# Patient Record
Sex: Male | Born: 1970 | Race: Black or African American | Hispanic: No | Marital: Married | Smoking: Never smoker
Health system: Southern US, Community
[De-identification: ages and names within clinical notes are randomized; demographics above are authoritative.]

## PROBLEM LIST (undated history)

## (undated) DIAGNOSIS — K922 Gastrointestinal hemorrhage, unspecified: Secondary | ICD-10-CM

## (undated) DIAGNOSIS — K219 Gastro-esophageal reflux disease without esophagitis: Secondary | ICD-10-CM

## (undated) HISTORY — PX: APPENDECTOMY: SHX54

---

## 2006-04-30 ENCOUNTER — Emergency Department (HOSPITAL_COMMUNITY): Admission: EM | Admit: 2006-04-30 | Discharge: 2006-04-30 | Payer: Self-pay | Admitting: Emergency Medicine

## 2009-04-08 ENCOUNTER — Emergency Department (HOSPITAL_COMMUNITY): Admission: EM | Admit: 2009-04-08 | Discharge: 2009-04-09 | Payer: Self-pay | Admitting: Emergency Medicine

## 2009-05-10 ENCOUNTER — Emergency Department (HOSPITAL_COMMUNITY): Admission: EM | Admit: 2009-05-10 | Discharge: 2009-05-10 | Payer: Self-pay | Admitting: Emergency Medicine

## 2009-12-07 ENCOUNTER — Emergency Department (HOSPITAL_BASED_OUTPATIENT_CLINIC_OR_DEPARTMENT_OTHER)
Admission: EM | Admit: 2009-12-07 | Discharge: 2009-12-07 | Payer: Self-pay | Source: Home / Self Care | Admitting: Emergency Medicine

## 2010-05-29 LAB — CBC
MCHC: 33.4 g/dL (ref 30.0–36.0)
MCV: 87.6 fL (ref 78.0–100.0)
Platelets: 225 10*3/uL (ref 150–400)
RBC: 4.83 MIL/uL (ref 4.22–5.81)
RDW: 12.9 % (ref 11.5–15.5)

## 2010-05-29 LAB — BASIC METABOLIC PANEL
BUN: 10 mg/dL (ref 6–23)
CO2: 28 mEq/L (ref 19–32)
Chloride: 103 mEq/L (ref 96–112)
Glucose, Bld: 113 mg/dL — ABNORMAL HIGH (ref 70–99)
Potassium: 3.8 mEq/L (ref 3.5–5.1)

## 2010-05-29 LAB — DIFFERENTIAL
Eosinophils Relative: 1 % (ref 0–5)
Lymphocytes Relative: 8 % — ABNORMAL LOW (ref 12–46)
Lymphs Abs: 0.5 10*3/uL — ABNORMAL LOW (ref 0.7–4.0)
Monocytes Absolute: 0.9 10*3/uL (ref 0.1–1.0)
Monocytes Relative: 14 % — ABNORMAL HIGH (ref 3–12)

## 2010-06-01 LAB — CBC
HCT: 41.6 % (ref 39.0–52.0)
Hemoglobin: 13.9 g/dL (ref 13.0–17.0)
Platelets: 292 10*3/uL (ref 150–400)

## 2010-06-01 LAB — DIFFERENTIAL
Eosinophils Absolute: 0.1 10*3/uL (ref 0.0–0.7)
Lymphs Abs: 2 10*3/uL (ref 0.7–4.0)
Monocytes Absolute: 0.6 10*3/uL (ref 0.1–1.0)
Monocytes Relative: 15 % — ABNORMAL HIGH (ref 3–12)
Neutrophils Relative %: 36 % — ABNORMAL LOW (ref 43–77)

## 2010-06-01 LAB — URINALYSIS, ROUTINE W REFLEX MICROSCOPIC
Glucose, UA: NEGATIVE mg/dL
Nitrite: NEGATIVE
Specific Gravity, Urine: 1.012 (ref 1.005–1.030)
pH: 7.5 (ref 5.0–8.0)

## 2010-06-01 LAB — COMPREHENSIVE METABOLIC PANEL
Albumin: 3.9 g/dL (ref 3.5–5.2)
Alkaline Phosphatase: 65 U/L (ref 39–117)
BUN: 12 mg/dL (ref 6–23)
CO2: 28 mEq/L (ref 19–32)
Chloride: 106 mEq/L (ref 96–112)
Creatinine, Ser: 0.9 mg/dL (ref 0.4–1.5)
GFR calc non Af Amer: >60 mL/min (ref >60)
Glucose, Bld: 78 mg/dL (ref 70–99)
Potassium: 3.7 mEq/L (ref 3.5–5.1)
Total Bilirubin: 0.8 mg/dL (ref 0.3–1.2)

## 2010-12-06 ENCOUNTER — Emergency Department (INDEPENDENT_AMBULATORY_CARE_PROVIDER_SITE_OTHER)

## 2010-12-06 ENCOUNTER — Encounter: Payer: Self-pay | Admitting: *Deleted

## 2010-12-06 ENCOUNTER — Emergency Department (HOSPITAL_BASED_OUTPATIENT_CLINIC_OR_DEPARTMENT_OTHER)
Admission: EM | Admit: 2010-12-06 | Discharge: 2010-12-06 | Disposition: A | Discharge status: Home / Self Care | Attending: Emergency Medicine | Admitting: Emergency Medicine

## 2010-12-06 DIAGNOSIS — R1011 Right upper quadrant pain: Secondary | ICD-10-CM | POA: Insufficient documentation

## 2010-12-06 DIAGNOSIS — R1013 Epigastric pain: Secondary | ICD-10-CM

## 2010-12-06 DIAGNOSIS — R079 Chest pain, unspecified: Secondary | ICD-10-CM

## 2010-12-06 LAB — URINALYSIS, ROUTINE W REFLEX MICROSCOPIC
Ketones, ur: NEGATIVE mg/dL
Leukocytes, UA: NEGATIVE
Nitrite: NEGATIVE
Protein, ur: NEGATIVE mg/dL
pH: 7.5 (ref 5.0–8.0)

## 2010-12-06 LAB — COMPREHENSIVE METABOLIC PANEL
AST: 16 U/L (ref 0–37)
CO2: 31 mEq/L (ref 19–32)
Calcium: 9.7 mg/dL (ref 8.4–10.5)
Creatinine, Ser: 1 mg/dL (ref 0.50–1.35)
GFR calc Af Amer: >60 mL/min (ref >60)
GFR calc non Af Amer: >60 mL/min (ref >60)
Glucose, Bld: 87 mg/dL (ref 70–99)

## 2010-12-06 MED ORDER — PANTOPRAZOLE SODIUM 20 MG PO TBEC: 40.0000 mg | DELAYED_RELEASE_TABLET | Freq: Every day | ORAL | Status: DC

## 2010-12-06 MED ORDER — SODIUM CHLORIDE 0.9 % IV SOLN
Freq: Once | INTRAVENOUS | Status: AC
  Administered 2010-12-06: 15:00:00 via INTRAVENOUS

## 2010-12-06 MED ORDER — GI COCKTAIL ~~LOC~~
ORAL | Status: AC
  Administered 2010-12-06: 30 mL
  Filled 2010-12-06: qty 30

## 2010-12-06 MED ORDER — PANTOPRAZOLE SODIUM 40 MG PO TBEC
40.0000 mg | DELAYED_RELEASE_TABLET | Freq: Once | ORAL | Status: AC
  Administered 2010-12-06: 40 mg via ORAL
  Filled 2010-12-06: qty 1

## 2010-12-06 NOTE — ED Notes (Signed)
Urinal provided.

## 2010-12-06 NOTE — ED Notes (Signed)
Pt transported to radiology.

## 2010-12-06 NOTE — ED Provider Notes (Signed)
History     CSN: [id number] Arrival date & time: 12/06/2010  1:41 PM  Chief Complaint  Patient presents with  . Abdominal Pain    HPI  (Consider location/radiation/quality/duration/timing/severity/associated sxs/prior treatment)  HPI Comments: States entire abdomen hurts but it is worst in RUQ and epigastrium.  Pain improves with eating.  Patient is a 40 y.o. male presenting with abdominal pain. The history is provided by the patient. No language interpreter was used.  Abdominal Pain The primary symptoms of the illness include abdominal pain. The primary symptoms of the illness do not include fever, nausea, vomiting, diarrhea or hematochezia. Episode onset: 2 weeks ago. The onset of the illness was gradual. The problem has not changed since onset. The patient has not had a change in bowel habit. Additional symptoms associated with the illness include back pain. Symptoms associated with the illness do not include diaphoresis, heartburn, constipation, urgency, hematuria or frequency.    History reviewed. No pertinent past medical history.  Past Surgical History  Procedure Date  . Appendectomy     History reviewed. No pertinent family history.  History  Substance Use Topics  . Smoking status: Never Smoker   . Smokeless tobacco: Not on file  . Alcohol Use: No      Review of Systems  Review of Systems  Constitutional: Negative for fever and diaphoresis.  Gastrointestinal: Positive for abdominal pain. Negative for heartburn, nausea, vomiting, diarrhea, constipation, blood in stool, hematochezia and rectal pain.  Genitourinary: Negative for urgency, frequency and hematuria.  Musculoskeletal: Positive for back pain.    Allergies  Review of patient's allergies indicates no known allergies.  Home Medications  No current outpatient prescriptions on file.  Physical Exam    BP 121/70  Pulse 62  Temp(Src) 97.8 F (36.6 C) (Oral)  Resp 16  Ht 5\' 5"  (1.651 m)  Wt 175 lb  (79.379 kg)  BMI 29.12 kg/m2  SpO2 100%  Physical Exam  Nursing note and vitals reviewed. Constitutional: He is oriented to person, place, and time. He appears well-developed and well-nourished. No distress.  HENT:  Head: Normocephalic and atraumatic.  Eyes: EOM are normal.  Neck: Normal range of motion.  Cardiovascular: Normal rate, regular rhythm, normal heart sounds and intact distal pulses.   Pulmonary/Chest: Effort normal and breath sounds normal. No respiratory distress.  Abdominal: Soft. He exhibits no distension and no mass. There is tenderness. There is no rebound and no guarding.       + murphy's sign.  + PT in epigastrium  Musculoskeletal: Normal range of motion.       Lumbar back: He exhibits pain. He exhibits normal range of motion and no swelling.       Arms: Neurological: He is alert and oriented to person, place, and time.  Skin: Skin is warm and dry. He is not diaphoretic.  Psychiatric: He has a normal mood and affect. Judgment normal.    ED Course  Procedures (including critical care time)   Labs Reviewed  COMPREHENSIVE METABOLIC PANEL  LIPASE, BLOOD  URINALYSIS, ROUTINE W REFLEX MICROSCOPIC   No results found.   No diagnosis found.   MDM   1750-reviewed labs and xray ressults with pt.  He has been given a GI cocktail.  i will check back on him shortly to see if it has helped.     Worthy Rancher, PA 12/06/10 1517  Worthy Rancher, PA 12/20/10 [phone]

## 2010-12-06 NOTE — ED Notes (Signed)
Hyacinth Meeker, PA-C at bedside speaking with pt.

## 2010-12-06 NOTE — ED Notes (Signed)
Pt c/o generalized abd " squeezing" around abd and ribs x 2 weeks

## 2010-12-14 NOTE — ED Provider Notes (Signed)
Medical screening examination/treatment/procedure(s) were performed by non-physician practitioner and as supervising physician I was immediately available for consultation/collaboration.   Charles B. Bernette Mayers, MD 12/14/10 [phone]

## 2010-12-31 NOTE — ED Provider Notes (Signed)
History/physical exam/procedure(s) were performed by non-physician practitioner and as supervising physician I was immediately available for consultation/collaboration. I have reviewed all notes and am in agreement with care and plan.   Hilario Quarry, MD 12/31/10 [phone]

## 2012-05-20 ENCOUNTER — Emergency Department (HOSPITAL_COMMUNITY)

## 2012-05-20 ENCOUNTER — Emergency Department (HOSPITAL_COMMUNITY)
Admission: EM | Admit: 2012-05-20 | Discharge: 2012-05-20 | Disposition: A | Discharge status: Home / Self Care | Attending: Emergency Medicine | Admitting: Emergency Medicine

## 2012-05-20 ENCOUNTER — Encounter (HOSPITAL_COMMUNITY): Payer: Self-pay | Admitting: Emergency Medicine

## 2012-05-20 DIAGNOSIS — M549 Dorsalgia, unspecified: Secondary | ICD-10-CM | POA: Insufficient documentation

## 2012-05-20 DIAGNOSIS — K219 Gastro-esophageal reflux disease without esophagitis: Secondary | ICD-10-CM | POA: Insufficient documentation

## 2012-05-20 DIAGNOSIS — R071 Chest pain on breathing: Secondary | ICD-10-CM | POA: Insufficient documentation

## 2012-05-20 LAB — CBC WITH DIFFERENTIAL/PLATELET
Basophils Absolute: 0.1 10*3/uL (ref 0.0–0.1)
Basophils Relative: 1 % (ref 0–1)
HCT: 40.7 % (ref 39.0–52.0)
Hemoglobin: 14.1 g/dL (ref 13.0–17.0)
Lymphocytes Relative: 47 % — ABNORMAL HIGH (ref 12–46)
MCHC: 34.6 g/dL (ref 30.0–36.0)
Monocytes Absolute: 0.7 10*3/uL (ref 0.1–1.0)
Neutro Abs: 1.5 10*3/uL — ABNORMAL LOW (ref 1.7–7.7)
Neutrophils Relative %: 33 % — ABNORMAL LOW (ref 43–77)
RDW: 12.8 % (ref 11.5–15.5)
WBC: 4.7 10*3/uL (ref 4.0–10.5)

## 2012-05-20 LAB — COMPREHENSIVE METABOLIC PANEL
ALT: 20 U/L (ref 0–53)
BUN: 15 mg/dL (ref 6–23)
CO2: 31 mEq/L (ref 19–32)
Calcium: 9.2 mg/dL (ref 8.4–10.5)
GFR calc Af Amer: >90 mL/min (ref >90)
GFR calc non Af Amer: 82 mL/min — ABNORMAL LOW (ref >90)
Glucose, Bld: 90 mg/dL (ref 70–99)
Total Protein: 7.1 g/dL (ref 6.0–8.3)

## 2012-05-20 LAB — POCT I-STAT TROPONIN I: Troponin i, poc: 0 ng/mL (ref 0.00–0.08)

## 2012-05-20 MED ORDER — OMEPRAZOLE 20 MG PO CPDR: 40.0000 mg | DELAYED_RELEASE_CAPSULE | Freq: Every day | ORAL | Status: DC

## 2012-05-20 MED ORDER — PANTOPRAZOLE SODIUM 40 MG PO TBEC
40.0000 mg | DELAYED_RELEASE_TABLET | Freq: Once | ORAL | Status: AC
  Administered 2012-05-20: 40 mg via ORAL
  Filled 2012-05-20: qty 1

## 2012-05-20 MED ORDER — KETOROLAC TROMETHAMINE 30 MG/ML IJ SOLN
30.0000 mg | Freq: Once | INTRAMUSCULAR | Status: AC
  Administered 2012-05-20: 30 mg via INTRAVENOUS
  Filled 2012-05-20: qty 1

## 2012-05-20 MED ORDER — IBUPROFEN 600 MG PO TABS: 600.0000 mg | ORAL_TABLET | Freq: Three times a day (TID) | ORAL | Status: DC | PRN

## 2012-05-20 MED ORDER — GI COCKTAIL ~~LOC~~
30.0000 mL | Freq: Once | ORAL | Status: AC
  Administered 2012-05-20: 30 mL via ORAL
  Filled 2012-05-20: qty 30

## 2012-05-20 MED ORDER — METHOCARBAMOL 500 MG PO TABS: 500.0000 mg | ORAL_TABLET | Freq: Three times a day (TID) | ORAL | Status: DC

## 2012-05-20 NOTE — ED Notes (Signed)
Pt states he is having pain around his ribs and into his back  Pt states it has been hurting for 2 weeks  Pt states it is hard for him to lay on his back

## 2012-05-20 NOTE — ED Provider Notes (Signed)
History     CSN: [id number]  Arrival date & time 05/20/12  0038   None     Chief Complaint  Patient presents with  . chest wall pain     (Consider location/radiation/quality/duration/timing/severity/associated sxs/prior treatment) HPI Melvin Medina is a 42 y.o. male presenting with left chest wall pain and left back pain that started after working out 2 weeks ago, it is exacerbated by lifting weights.  He has had similar sx before and waas treated for GERD, but is not takingn any of those medications before.  Eating does not exace Left-sided chest wall, feels like musculoskeletal patient, not pleuritic, no dGE risk, intermittent, worse at the gym which she's taken some time off, going on for 2 weeks. History reviewed. No pertinent past medical history.  Past Surgical History  Procedure Laterality Date  . Appendectomy      History reviewed. No pertinent family history.  History  Substance Use Topics  . Smoking status: Never Smoker   . Smokeless tobacco: Not on file  . Alcohol Use: No      Review of Systems At least 10pt or greater review of systems completed and are negative except where specified in the HPI.  Allergies  Review of patient's allergies indicates no known allergies.  Home Medications  No current outpatient prescriptions on file.  BP 116/66  Pulse 58  Temp(Src) 98 F (36.7 C) (Oral)  Resp 16  SpO2 98%  Physical Exam  Nursing notes reviewed.  Electronic medical record reviewed. VITAL SIGNS:   Filed Vitals:   05/20/12 0049 05/20/12 0707 05/20/12 0838  BP: 116/66 109/84 101/77  Pulse: 58 52 55  Temp: 98 F (36.7 C)  98.6 F (37 C)  TempSrc: Oral  Oral  Resp: 16 16   SpO2: 98% 100% 100%   CONSTITUTIONAL: Awake, oriented, appears non-toxic HENT: Atraumatic, normocephalic, oral mucosa pink and moist, airway patent. Nares patent without drainage. External ears normal. EYES: Conjunctiva clear, EOMI, PERRLA NECK: Trachea midline,  non-tender, supple CARDIOVASCULAR: Normal heart rate, Normal rhythm, No murmurs, rubs, gallops PULMONARY/CHEST: Clear to auscultation, no rhonchi, wheezes, or rales. Symmetrical breath sounds. Non-tender. ABDOMINAL: Non-distended, soft, non-tender - no rebound or guarding.  BS normal. BACK: No steps off or deformity, some pain left lumbar thoracic muscles. NEUROLOGIC: Non-focal, moving all four extremities, no gross sensory or motor deficits. EXTREMITIES: No clubbing, cyanosis, or edema SKIN: Warm, Dry, No erythema, No rash  ED Course  Procedures (including critical care time)  Date: 05/20/2012  Rate: 54  Rhythm: Sinus bradycardia  QRS Axis: normal  Intervals: normal  ST/T Wave abnormalities: normal  Conduction Disutrbances: none  Narrative Interpretation: Patient's last EKG shows sinus tachycardia patient's last EKG also showed a possible rightward axis is not evident here, this is a normal axis, no significant changes, no ST or T wave abnormalities consistent with acute ischemia or infarction  Labs Reviewed  COMPREHENSIVE METABOLIC PANEL - Abnormal; Notable for the following:    GFR calc non Af Amer 82 (*)    All other components within normal limits  CBC WITH DIFFERENTIAL - Abnormal; Notable for the following:    Neutrophils Relative 33 (*)    Neutro Abs 1.5 (*)    Lymphocytes Relative 47 (*)    Monocytes Relative 16 (*)    All other components within normal limits  LIPASE, BLOOD  POCT I-STAT TROPONIN I   No results found.   1. Chest wall pain   2. GERD (gastroesophageal reflux disease)  MDM  PT presents with likely MSK pain vs GERD or both concurrently, doubt ACS, likewise doubt pericarditis, aortic dissection, pulmonary embolism, tension pneumothorax, pneumonia, and esophageal rupture.  CXR as viewed by myself is non-acute, EKG shows sinus bradycardia.  No relief from GI cocktail, feels better with toradol.  Rx for protonix given, use OTC acid relievers PRN.   Judicious use of NSAIDS, tylenol for back/chest wall pain.   I explained the diagnosis and have given explicit precautions to return to the ER including any other new or worsening symptoms. The patient understands and accepts the medical plan as it's been dictated and I have answered their questions. Discharge instructions concerning home care and prescriptions have been given.  The patient is STABLE and is discharged to home in good condition.           Jones Skene, MD 05/23/12 1107

## 2013-02-25 ENCOUNTER — Encounter (HOSPITAL_COMMUNITY): Payer: Self-pay | Admitting: Emergency Medicine

## 2013-02-25 ENCOUNTER — Emergency Department (HOSPITAL_COMMUNITY)
Admission: EM | Admit: 2013-02-25 | Discharge: 2013-02-26 | Disposition: A | Discharge status: Home / Self Care | Attending: Emergency Medicine | Admitting: Emergency Medicine

## 2013-02-25 DIAGNOSIS — M545 Low back pain, unspecified: Secondary | ICD-10-CM | POA: Insufficient documentation

## 2013-02-25 DIAGNOSIS — M7918 Myalgia, other site: Secondary | ICD-10-CM

## 2013-02-25 DIAGNOSIS — M549 Dorsalgia, unspecified: Secondary | ICD-10-CM

## 2013-02-25 NOTE — ED Notes (Signed)
Pt states having chest pain x 1 wk; constant; states worse today after lifting weights; c/o back pain

## 2013-02-26 MED ORDER — METHOCARBAMOL 750 MG PO TABS: 750.0000 mg | ORAL_TABLET | Freq: Three times a day (TID) | ORAL | Status: DC

## 2013-02-26 MED ORDER — NAPROXEN 500 MG PO TABS: 500.0000 mg | ORAL_TABLET | Freq: Two times a day (BID) | ORAL | Status: DC

## 2013-02-26 MED ORDER — METHOCARBAMOL 500 MG PO TABS
1000.0000 mg | ORAL_TABLET | Freq: Once | ORAL | Status: AC
  Administered 2013-02-26: 1000 mg via ORAL
  Filled 2013-02-26: qty 2

## 2013-02-26 MED ORDER — NAPROXEN 500 MG PO TABS
500.0000 mg | ORAL_TABLET | Freq: Two times a day (BID) | ORAL | Status: DC
  Administered 2013-02-26: 500 mg via ORAL
  Filled 2013-02-26: qty 1

## 2013-02-26 NOTE — ED Provider Notes (Signed)
CSN: [id number]     Arrival date & time 02/25/13  2349 History   First MD Initiated Contact with Patient 02/26/13 0000     Chief Complaint  Patient presents with  . Chest Pain   (Consider location/radiation/quality/duration/timing/severity/associated sxs/prior Treatment) HPI 42 year old male presents to emergency room with complaint of right-sided back pain extending around into his right chest.  He reports pain also on the left lower back extending around to his abdomen, but this is less frequently. Patient reports pain started about a week ago after he was lifting weights.  Patient reports he's been taking Advil which helps for a few hours, but then the pain returns.  Patient tried [place] powder today, which lasted longer.  He denies any shortness of breath, no fever, no cough.  Pain is worse with movement and palpation.  No prior history of coronary disease, asthma, or other lung, heart issues. History reviewed. No pertinent past medical history. Past Surgical History  Procedure Laterality Date  . Appendectomy     No family history on file. History  Substance Use Topics  . Smoking status: Never Smoker   . Smokeless tobacco: Not on file  . Alcohol Use: No    Review of Systems  See History of Present Illness; otherwise all other systems are reviewed and negative Allergies  Review of patient's allergies indicates no known allergies.  Home Medications   Current Outpatient Rx  Name  Route  Sig  Dispense  Refill  . Aspirin-Acetaminophen-Caffeine (GOODYS EXTRA STRENGTH PO)   Oral   Take 1 packet by mouth daily as needed. For pain         . ibuprofen (ADVIL,MOTRIN) 200 MG tablet   Oral   Take 400-600 mg by mouth every 6 (six) hours as needed.          BP 123/71  Pulse 64  Temp(Src) 97.9 F (36.6 C) (Oral)  Resp 15  SpO2 96% Physical Exam  Nursing note and vitals reviewed. Constitutional: He is oriented to person, place, and time. He appears well-developed and  well-nourished. No distress.  HENT:  Head: Normocephalic and atraumatic.  Nose: Nose normal.  Mouth/Throat: Oropharynx is clear and moist.  Eyes: Conjunctivae and EOM are normal. Pupils are equal, round, and reactive to light.  Neck: Normal range of motion. Neck supple. No JVD present. No tracheal deviation present. No thyromegaly present.  Cardiovascular: Normal rate, regular rhythm, normal heart sounds and intact distal pulses.  Exam reveals no gallop and no friction rub.   No murmur heard. Pulmonary/Chest: Effort normal and breath sounds normal. No stridor. No respiratory distress. He has no wheezes. He has no rales. He exhibits no tenderness.  Abdominal: Soft. Bowel sounds are normal. He exhibits no distension and no mass. There is no tenderness. There is no rebound and no guarding.  No epigastric pain, no abdominal pain  Musculoskeletal: Normal range of motion. He exhibits tenderness. He exhibits no edema.  Patient has mild tenderness to paraspinal muscles along right lumbar.  Patient also tender across right trapezius and inferior rotator cuff.  Pain with movement of the right shoulder.  Lymphadenopathy:    He has no cervical adenopathy.  Neurological: He is alert and oriented to person, place, and time. He has normal reflexes. He exhibits normal muscle tone. Coordination normal.  Skin: Skin is warm and dry. No rash noted. No erythema. No pallor.  Psychiatric: He has a normal mood and affect. His behavior is normal. Judgment and thought content normal.  ED Course  Procedures (including critical care time) Labs Review Labs Reviewed - No data to display Imaging Review No results found.  EKG Interpretation    Date/Time:  Wednesday February 26 2013 00:01:10 EST Ventricular Rate:  65 PR Interval:  170 QRS Duration: 101 QT Interval:  386 QTC Calculation: 401 R Axis:   84 Text Interpretation:  Sinus rhythm Benign early repolarization No significant change since last tracing  Confirmed by Jazir Newey  MD, Cranford Blessinger ([id number]) on 02/26/2013 12:04:15 AM            MDM   1. Back pain   2. Musculoskeletal pain    42 year old male with musculoskeletal pain.  Will treat with Robaxin and Naprosyn.  Will refer to local.  Primary care Dr. for further evaluation.  Patient is requesting IV medication, and GI cocktail, but at this time.  He does not report any signs of GERD, and I do not feel  IV medication is warranted in his situation.     Olivia Mackie, MD 02/26/13 [phone]

## 2013-02-28 ENCOUNTER — Encounter (HOSPITAL_BASED_OUTPATIENT_CLINIC_OR_DEPARTMENT_OTHER): Payer: Self-pay | Admitting: Emergency Medicine

## 2013-02-28 ENCOUNTER — Emergency Department (HOSPITAL_BASED_OUTPATIENT_CLINIC_OR_DEPARTMENT_OTHER)
Admission: EM | Admit: 2013-02-28 | Discharge: 2013-02-28 | Disposition: A | Discharge status: Home / Self Care | Attending: Emergency Medicine | Admitting: Emergency Medicine

## 2013-02-28 ENCOUNTER — Emergency Department (HOSPITAL_BASED_OUTPATIENT_CLINIC_OR_DEPARTMENT_OTHER)

## 2013-02-28 DIAGNOSIS — M549 Dorsalgia, unspecified: Secondary | ICD-10-CM | POA: Insufficient documentation

## 2013-02-28 DIAGNOSIS — Z791 Long term (current) use of non-steroidal anti-inflammatories (NSAID): Secondary | ICD-10-CM | POA: Insufficient documentation

## 2013-02-28 DIAGNOSIS — R1110 Vomiting, unspecified: Secondary | ICD-10-CM | POA: Insufficient documentation

## 2013-02-28 DIAGNOSIS — R109 Unspecified abdominal pain: Secondary | ICD-10-CM

## 2013-02-28 DIAGNOSIS — R1084 Generalized abdominal pain: Secondary | ICD-10-CM | POA: Insufficient documentation

## 2013-02-28 DIAGNOSIS — Z79899 Other long term (current) drug therapy: Secondary | ICD-10-CM | POA: Insufficient documentation

## 2013-02-28 LAB — CBC WITH DIFFERENTIAL/PLATELET
Basophils Absolute: 0 10*3/uL (ref 0.0–0.1)
Basophils Relative: 1 % (ref 0–1)
Eosinophils Absolute: 0.1 10*3/uL (ref 0.0–0.7)
Eosinophils Relative: 2 % (ref 0–5)
HCT: 39.1 % (ref 39.0–52.0)
Hemoglobin: 13.7 g/dL (ref 13.0–17.0)
Lymphocytes Relative: 40 % (ref 12–46)
Lymphs Abs: 1.4 10*3/uL (ref 0.7–4.0)
MCH: 29.4 pg (ref 26.0–34.0)
MCHC: 35 g/dL (ref 30.0–36.0)
MCV: 83.9 fL (ref 78.0–100.0)
Monocytes Absolute: 0.7 10*3/uL (ref 0.1–1.0)
Monocytes Relative: 21 % — ABNORMAL HIGH (ref 3–12)
Neutro Abs: 1.2 10*3/uL — ABNORMAL LOW (ref 1.7–7.7)
Neutrophils Relative %: 36 % — ABNORMAL LOW (ref 43–77)
Platelets: 223 10*3/uL (ref 150–400)
RBC: 4.66 MIL/uL (ref 4.22–5.81)
RDW: 12.3 % (ref 11.5–15.5)
WBC: 3.4 10*3/uL — ABNORMAL LOW (ref 4.0–10.5)

## 2013-02-28 LAB — COMPREHENSIVE METABOLIC PANEL
ALT: 21 U/L (ref 0–53)
AST: 20 U/L (ref 0–37)
Albumin: 4.1 g/dL (ref 3.5–5.2)
Alkaline Phosphatase: 62 U/L (ref 39–117)
BUN: 15 mg/dL (ref 6–23)
CO2: 25 mEq/L (ref 19–32)
Calcium: 8.9 mg/dL (ref 8.4–10.5)
Chloride: 104 mEq/L (ref 96–112)
Creatinine, Ser: 1.1 mg/dL (ref 0.50–1.35)
GFR calc Af Amer: >90 mL/min (ref >90)
GFR calc non Af Amer: 81 mL/min — ABNORMAL LOW (ref >90)
Glucose, Bld: 104 mg/dL — ABNORMAL HIGH (ref 70–99)
Potassium: 3.7 mEq/L (ref 3.5–5.1)
Sodium: 139 mEq/L (ref 135–145)
Total Bilirubin: 0.6 mg/dL (ref 0.3–1.2)
Total Protein: 6.9 g/dL (ref 6.0–8.3)

## 2013-02-28 LAB — URINALYSIS, ROUTINE W REFLEX MICROSCOPIC
Glucose, UA: NEGATIVE mg/dL
Hgb urine dipstick: NEGATIVE
Ketones, ur: NEGATIVE mg/dL
Leukocytes, UA: NEGATIVE
pH: 5.5 (ref 5.0–8.0)

## 2013-02-28 MED ORDER — GI COCKTAIL ~~LOC~~: 30.0000 mL | Freq: Once | ORAL | Status: AC

## 2013-02-28 MED ORDER — TRAMADOL HCL 50 MG PO TABS: 50.0000 mg | ORAL_TABLET | Freq: Four times a day (QID) | ORAL | Status: DC | PRN

## 2013-02-28 MED ORDER — DICYCLOMINE HCL 20 MG PO TABS: 20.0000 mg | ORAL_TABLET | Freq: Two times a day (BID) | ORAL | Status: DC

## 2013-02-28 MED ORDER — GI COCKTAIL ~~LOC~~
ORAL | Status: AC
  Administered 2013-02-28: 30 mL via ORAL
  Filled 2013-02-28: qty 30

## 2013-02-28 NOTE — ED Provider Notes (Signed)
CSN: [id number]     Arrival date & time 02/28/13  7829 History   First MD Initiated Contact with Patient 02/28/13 [phone]     Chief Complaint  Patient presents with  . Abdominal Pain  . Emesis  . Back Pain   (Consider location/radiation/quality/duration/timing/severity/associated sxs/prior Treatment) HPI Comments: Patient presents to the ER for evaluation of abdominal and back pain. Patient reports that the pain starts in the lower abdomen, goes up across the upper abdomen and then around his sides and into his back. He has had this for 2 weeks. Patient says that he was seen previously for this and told it was muscle. He tried to take Advil and a muscle relaxer this morning but the pain did not help. He says that he made himself vomit by sticking his finger down his throat earlier because he thought it would help his symptoms, but it did not. He has not had any diarrhea or constipation.  Patient is a 42 y.o. male presenting with abdominal pain, vomiting, and back pain.  Abdominal Pain Associated symptoms: vomiting   Associated symptoms: no fever and no shortness of breath   Emesis Associated symptoms: abdominal pain   Back Pain Associated symptoms: abdominal pain   Associated symptoms: no fever     History reviewed. No pertinent past medical history. Past Surgical History  Procedure Laterality Date  . Appendectomy     History reviewed. No pertinent family history. History  Substance Use Topics  . Smoking status: Never Smoker   . Smokeless tobacco: Not on file  . Alcohol Use: No    Review of Systems  Constitutional: Negative for fever.  Respiratory: Negative for shortness of breath.   Gastrointestinal: Positive for vomiting and abdominal pain.  Genitourinary: Negative.   Musculoskeletal: Positive for back pain.  All other systems reviewed and are negative.    Allergies  Review of patient's allergies indicates no known allergies.  Home Medications   Current Outpatient  Rx  Name  Route  Sig  Dispense  Refill  . Aspirin-Acetaminophen-Caffeine (GOODYS EXTRA STRENGTH PO)   Oral   Take 1 packet by mouth daily as needed. For pain         . ibuprofen (ADVIL,MOTRIN) 200 MG tablet   Oral   Take 400-600 mg by mouth every 6 (six) hours as needed.         . methocarbamol (ROBAXIN-750) 750 MG tablet   Oral   Take 1 tablet (750 mg total) by mouth 3 (three) times daily.   60 tablet   0   . naproxen (NAPROSYN) 500 MG tablet   Oral   Take 1 tablet (500 mg total) by mouth 2 (two) times daily.   30 tablet   0    BP 136/71  Pulse 54  Temp(Src) 97.8 F (36.6 C) (Oral)  Resp 16  Ht 5\' 6"  (1.676 m)  Wt 180 lb (81.647 kg)  BMI 29.07 kg/m2  SpO2 100% Physical Exam  Constitutional: He is oriented to person, place, and time. He appears well-developed and well-nourished. No distress.  HENT:  Head: Normocephalic and atraumatic.  Right Ear: Hearing normal.  Left Ear: Hearing normal.  Nose: Nose normal.  Mouth/Throat: Oropharynx is clear and moist and mucous membranes are normal.  Eyes: Conjunctivae and EOM are normal. Pupils are equal, round, and reactive to light.  Neck: Normal range of motion. Neck supple.  Cardiovascular: Regular rhythm, S1 normal and S2 normal.  Exam reveals no gallop and no friction rub.  No murmur heard. Pulmonary/Chest: Effort normal and breath sounds normal. No respiratory distress. He exhibits no tenderness.  Abdominal: Soft. Normal appearance and bowel sounds are normal. There is no hepatosplenomegaly. There is tenderness (very mild and diffuse, no focality). There is no rebound, no guarding, no tenderness at McBurney's point and negative Murphy's sign. No hernia.  Musculoskeletal: Normal range of motion.  Neurological: He is alert and oriented to person, place, and time. He has normal strength. No cranial nerve deficit or sensory deficit. Coordination normal. GCS eye subscore is 4. GCS verbal subscore is 5. GCS motor subscore is 6.   Skin: Skin is warm, dry and intact. No rash noted. No cyanosis.  Psychiatric: He has a normal mood and affect. His speech is normal and behavior is normal. Thought content normal.    ED Course  Procedures (including critical care time) Labs Review Labs Reviewed  CBC WITH DIFFERENTIAL - Abnormal; Notable for the following:    WBC 3.4 (*)    Neutrophils Relative % 36 (*)    Neutro Abs 1.2 (*)    Monocytes Relative 21 (*)    All other components within normal limits  COMPREHENSIVE METABOLIC PANEL - Abnormal; Notable for the following:    Glucose, Bld 104 (*)    GFR calc non Af Amer 81 (*)    All other components within normal limits  LIPASE, BLOOD  URINALYSIS, ROUTINE W REFLEX MICROSCOPIC   Imaging Review Dg Abd Acute W/chest  02/28/2013   CLINICAL DATA:  Abdominal pain  EXAM: ACUTE ABDOMEN SERIES (ABDOMEN 2 VIEW & CHEST 1 VIEW)  COMPARISON:  05/20/2012  FINDINGS: There is no evidence of dilated bowel loops or free intraperitoneal air. No radiopaque calculi or other significant radiographic abnormality is seen. Heart size and mediastinal contours are within normal limits. Both lungs are clear. Mild lower thoracic and lumbar scoliosis noted. No osseous abnormality.  IMPRESSION: Negative abdominal radiographs.  No acute cardiopulmonary disease.   Electronically Signed   By: Ruel Favors M.D.   On: 02/28/2013 07:54    EKG Interpretation   None       MDM  Diagnosis: Abdominal pain  Patient presents to ER for diffuse abdominal pain radiating up into the chest and back region. Symptoms have been ongoing for 2 weeks. Exam is benign. He has diffuse abdominal tenderness which is benign. There is actually no focality, no guarding, no rebound. Patient has had previous appendectomy. No tenderness in right upper quadrant suggest gallbladder disease. He does have tenderness, pain seems to radiate from the abdomen up into the chest. No concern for primary cardiac disease causing the patient's  symptoms.  Labs are unremarkable. He does not have a leukocytosis. Transabdominal including LFTs entirely normal. Renal function normal. Lipase normal. Urinalysis without any abnormalities. Symptoms of unclear etiology at this time, will refer to GI. Treat abdominal discomfort with Bentyl.   Gilda Crease, MD 02/28/13 [phone]

## 2013-02-28 NOTE — ED Notes (Signed)
Pt states abd pain/back pain for two weeks. States has had this before. This time started after lifting weights. States pain radiating into back and generalized in abdomen area. Emesis X1 this am.

## 2013-10-02 ENCOUNTER — Encounter (HOSPITAL_BASED_OUTPATIENT_CLINIC_OR_DEPARTMENT_OTHER): Payer: Self-pay | Admitting: Emergency Medicine

## 2013-10-02 ENCOUNTER — Emergency Department (HOSPITAL_BASED_OUTPATIENT_CLINIC_OR_DEPARTMENT_OTHER)

## 2013-10-02 ENCOUNTER — Emergency Department (HOSPITAL_BASED_OUTPATIENT_CLINIC_OR_DEPARTMENT_OTHER)
Admission: EM | Admit: 2013-10-02 | Discharge: 2013-10-02 | Disposition: A | Discharge status: Home / Self Care | Attending: Emergency Medicine | Admitting: Emergency Medicine

## 2013-10-02 DIAGNOSIS — Z7982 Long term (current) use of aspirin: Secondary | ICD-10-CM | POA: Insufficient documentation

## 2013-10-02 DIAGNOSIS — M545 Low back pain, unspecified: Secondary | ICD-10-CM | POA: Insufficient documentation

## 2013-10-02 DIAGNOSIS — Z791 Long term (current) use of non-steroidal anti-inflammatories (NSAID): Secondary | ICD-10-CM | POA: Insufficient documentation

## 2013-10-02 DIAGNOSIS — Z79899 Other long term (current) drug therapy: Secondary | ICD-10-CM | POA: Insufficient documentation

## 2013-10-02 DIAGNOSIS — M546 Pain in thoracic spine: Secondary | ICD-10-CM

## 2013-10-02 DIAGNOSIS — R079 Chest pain, unspecified: Secondary | ICD-10-CM | POA: Insufficient documentation

## 2013-10-02 DIAGNOSIS — M549 Dorsalgia, unspecified: Secondary | ICD-10-CM | POA: Insufficient documentation

## 2013-10-02 MED ORDER — TRAMADOL HCL 50 MG PO TABS: 50.0000 mg | ORAL_TABLET | Freq: Four times a day (QID) | ORAL | Status: DC | PRN

## 2013-10-02 MED ORDER — KETOROLAC TROMETHAMINE 60 MG/2ML IM SOLN
60.0000 mg | Freq: Once | INTRAMUSCULAR | Status: AC
  Administered 2013-10-02: 60 mg via INTRAMUSCULAR
  Filled 2013-10-02: qty 2

## 2013-10-02 MED ORDER — GI COCKTAIL ~~LOC~~
30.0000 mL | Freq: Once | ORAL | Status: AC
  Administered 2013-10-02: 30 mL via ORAL
  Filled 2013-10-02: qty 30

## 2013-10-02 NOTE — ED Notes (Signed)
C/o pain to anterior and posterior entire torso x 2 weeks

## 2013-10-02 NOTE — ED Provider Notes (Signed)
CSN: [id number]     Arrival date & time 10/02/13  1849 History   First MD Initiated Contact with Patient 10/02/13 1940     Chief Complaint  Patient presents with  . Chest Pain  . Back Pain     (Consider location/radiation/quality/duration/timing/severity/associated sxs/prior Treatment) Patient is a 43 y.o. male presenting with chest pain and back pain. The history is provided by the patient. No language interpreter was used.  Chest Pain Associated symptoms: back pain   Associated symptoms: no abdominal pain, no cough, no fever, no nausea, no shortness of breath and not vomiting   Associated symptoms comment:  The patient complains of back pain that comes forward to the chest x 2 weeks. No known injury. He denies SOB, nausea, cough. He denies tobacco use. No recent travel. The pain is worse with movement and better with rest.  Back Pain Associated symptoms: chest pain   Associated symptoms: no abdominal pain and no fever     History reviewed. No pertinent past medical history. Past Surgical History  Procedure Laterality Date  . Appendectomy     No family history on file. History  Substance Use Topics  . Smoking status: Never Smoker   . Smokeless tobacco: Not on file  . Alcohol Use: No    Review of Systems  Constitutional: Negative for fever.  Respiratory: Negative for cough and shortness of breath.   Cardiovascular: Positive for chest pain.  Gastrointestinal: Negative for nausea, vomiting and abdominal pain.  Musculoskeletal: Positive for back pain.      Allergies  Review of patient's allergies indicates no known allergies.  Home Medications   Prior to Admission medications   Medication Sig Start Date End Date Taking? Authorizing Provider  Aspirin-Acetaminophen-Caffeine (GOODYS EXTRA STRENGTH PO) Take 1 packet by mouth daily as needed. For pain    Historical Provider, MD  dicyclomine (BENTYL) 20 MG tablet Take 1 tablet (20 mg total) by mouth 2 (two) times daily.  02/28/13   Gilda Creasehristopher J. Pollina, MD  ibuprofen (ADVIL,MOTRIN) 200 MG tablet Take 400-600 mg by mouth every 6 (six) hours as needed.    Historical Provider, MD  methocarbamol (ROBAXIN-750) 750 MG tablet Take 1 tablet (750 mg total) by mouth 3 (three) times daily. 02/26/13   Olivia Mackielga M Otter, MD  naproxen (NAPROSYN) 500 MG tablet Take 1 tablet (500 mg total) by mouth 2 (two) times daily. 02/26/13   Olivia Mackielga M Otter, MD  traMADol (ULTRAM) 50 MG tablet Take 1 tablet (50 mg total) by mouth every 6 (six) hours as needed. 02/28/13   Gilda Creasehristopher J. Pollina, MD   BP 120/68  Pulse 67  Temp(Src) 98.3 F (36.8 C) (Oral)  Resp 18  Ht 5\' 6"  (1.676 m)  Wt 185 lb (83.915 kg)  BMI 29.87 kg/m2  SpO2 100% Physical Exam  Constitutional: He is oriented to person, place, and time. He appears well-developed and well-nourished.  HENT:  Head: Normocephalic.  Neck: Normal range of motion. Neck supple.  Cardiovascular: Normal rate and regular rhythm.   No murmur heard. Pulmonary/Chest: Effort normal and breath sounds normal. He has no wheezes. He has no rales. He exhibits no tenderness.  Abdominal: Soft. Bowel sounds are normal. There is no tenderness. There is no rebound and no guarding.  Musculoskeletal: Normal range of motion.  Bilateral paraspinal back tenderness from upper thoracic back to upper lumbar back. No swelling or discoloration.  Neurological: He is alert and oriented to person, place, and time.  Skin: Skin is warm and  dry. No rash noted.  Psychiatric: He has a normal mood and affect.    ED Course  Procedures (including critical care time) Labs Review Labs Reviewed - No data to display  Imaging Review No results found.   EKG Interpretation   Date/Time:  Thursday October 02 2013 18:59:01 EDT Ventricular Rate:  70 PR Interval:  158 QRS Duration: 82 QT Interval:  356 QTC Calculation: 384 R Axis:   33 Text Interpretation:  Normal sinus rhythm Normal ECG No significant change  since last  tracing Confirmed by HARRISON  MD, FORREST ([id number]) on 10/02/2013  7:10:11 PM      MDM   Final diagnoses:  None    1. Back pain  Review of the chart shows that the patient has had recurrent similar pain on multiple evaluations since 2013. CXR clear, EKG non-acute. The patient requested pain medications through an IV specifically which was declined. He is in NAD. He appears well. Stable for discharge.     Arnoldo Hooker, PA-C 10/02/13 2041

## 2013-10-02 NOTE — Discharge Instructions (Signed)
Back Pain, Adult Low back pain is very common. About 1 in 5 people have back pain.The cause of low back pain is rarely dangerous. The pain often gets better over time.About half of people with a sudden onset of back pain feel better in just 2 weeks. About 8 in 10 people feel better by 6 weeks.  CAUSES Some common causes of back pain include:  Strain of the muscles or ligaments supporting the spine.  Wear and tear (degeneration) of the spinal discs.  Arthritis.  Direct injury to the back. DIAGNOSIS Most of the time, the direct cause of low back pain is not known.However, back pain can be treated effectively even when the exact cause of the pain is unknown.Answering your caregiver's questions about your overall health and symptoms is one of the most accurate ways to make sure the cause of your pain is not dangerous. If your caregiver needs more information, he or she may order lab work or imaging tests (X-rays or MRIs).However, even if imaging tests show changes in your back, this usually does not require surgery. HOME CARE INSTRUCTIONS For many people, back pain returns.Since low back pain is rarely dangerous, it is often a condition that people can learn to manageon their own.   Remain active. It is stressful on the back to sit or stand in one place. Do not sit, drive, or stand in one place for more than 30 minutes at a time. Take short walks on level surfaces as soon as pain allows.Try to increase the length of time you walk each day.  Do not stay in bed.Resting more than 1 or 2 days can delay your recovery.  Do not avoid exercise or work.Your body is made to move.It is not dangerous to be active, even though your back may hurt.Your back will likely heal faster if you return to being active before your pain is gone.  Pay attention to your body when you bend and lift. Many people have less discomfortwhen lifting if they bend their knees, keep the load close to their bodies,and  avoid twisting. Often, the most comfortable positions are those that put less stress on your recovering back.  Find a comfortable position to sleep. Use a firm mattress and lie on your side with your knees slightly bent. If you lie on your back, put a pillow under your knees.  Only take over-the-counter or prescription medicines as directed by your caregiver. Over-the-counter medicines to reduce pain and inflammation are often the most helpful.Your caregiver may prescribe muscle relaxant drugs.These medicines help dull your pain so you can more quickly return to your normal activities and healthy exercise.  Put ice on the injured area.  Put ice in a plastic bag.  Place a towel between your skin and the bag.  Leave the ice on for 15-20 minutes, 03-04 times a day for the first 2 to 3 days. After that, ice and heat may be alternated to reduce pain and spasms.  Ask your caregiver about trying back exercises and gentle massage. This may be of some benefit.  Avoid feeling anxious or stressed.Stress increases muscle tension and can worsen back pain.It is important to recognize when you are anxious or stressed and learn ways to manage it.Exercise is a great option. SEEK MEDICAL CARE IF:  You have pain that is not relieved with rest or medicine.  You have pain that does not improve in 1 week.  You have new symptoms.  You are generally not feeling well. SEEK   IMMEDIATE MEDICAL CARE IF:   You have pain that radiates from your back into your legs.  You develop new bowel or bladder control problems.  You have unusual weakness or numbness in your arms or legs.  You develop nausea or vomiting.  You develop abdominal pain.  You feel faint. Document Released: 02/27/2005 Document Revised: 08/29/2011 Document Reviewed: 07/01/2013 [employer] Patient Information 2015 [employer]. This information is not intended to replace advice given to you by your health care provider. Make sure you  discuss any questions you have with your health care provider.  

## 2013-10-03 NOTE — ED Provider Notes (Signed)
Medical screening examination/treatment/procedure(s) were performed by non-physician practitioner and as supervising physician I was immediately available for consultation/collaboration.   EKG Interpretation   Date/Time:  Thursday October 02 2013 18:59:01 EDT Ventricular Rate:  70 PR Interval:  158 QRS Duration: 82 QT Interval:  356 QTC Calculation: 384 R Axis:   33 Text Interpretation:  Normal sinus rhythm Normal ECG No significant change  since last tracing Confirmed by Danilynn Jemison  MD, Dorena Dorfman ([id number]) on 10/02/2013  7:10:11 PM        Randa SpikeForrest Mort SawyersS Elinora Weigand, MD 10/03/13 [phone]

## 2013-10-14 ENCOUNTER — Ambulatory Visit (INDEPENDENT_AMBULATORY_CARE_PROVIDER_SITE_OTHER): Admitting: Family Medicine

## 2013-10-14 VITALS — BP 100/72 | HR 73 | Temp 98.2°F | Resp 16 | Ht 70.0 in | Wt 187.4 lb

## 2013-10-14 DIAGNOSIS — M545 Low back pain, unspecified: Secondary | ICD-10-CM | POA: Insufficient documentation

## 2013-10-14 DIAGNOSIS — R140 Abdominal distension (gaseous): Secondary | ICD-10-CM | POA: Insufficient documentation

## 2013-10-14 DIAGNOSIS — K5900 Constipation, unspecified: Secondary | ICD-10-CM

## 2013-10-14 DIAGNOSIS — M546 Pain in thoracic spine: Principal | ICD-10-CM

## 2013-10-14 DIAGNOSIS — K219 Gastro-esophageal reflux disease without esophagitis: Secondary | ICD-10-CM | POA: Insufficient documentation

## 2013-10-14 MED ORDER — POLYETHYLENE GLYCOL 3350 17 GM/SCOOP PO POWD: 17.0000 g | Freq: Two times a day (BID) | ORAL | Status: DC | PRN

## 2013-10-14 MED ORDER — CYCLOBENZAPRINE HCL 5 MG PO TABS: 5.0000 mg | ORAL_TABLET | Freq: Three times a day (TID) | ORAL | Status: DC | PRN

## 2013-10-14 NOTE — Patient Instructions (Signed)
-  Follow-up with physical therapy -Take prescribed medications as directed -If your symptoms do to improve within the next 4-6 weeks, then follow-up with a primary care physician. If symptoms acutely worsen in the interim, then return to our clinic or go to the Emergency Dept if needed.

## 2013-10-14 NOTE — Progress Notes (Signed)
Subjective:    Patient ID: Melvin BosworthMoustapha Medina, male    DOB: 05/09/1970, 43 y.o.   MRN: [id number]  HPI The patient presents today for back and abdominal pain. He notes that he has difficulty laying flat. He has tried taking 8 Aleve daily. [facility] for back pain and abdominal pain. Onset of symptoms has been for at least 1 year. Location of the pain is bilateral low back and upper back. Severity of pain is 10/10 at times. Character is a pulling. Symptoms are aggravated with laying still for prolonged periods of time, twisting, or bending. Frequency is intermittent, depending on his activity level. He is not currently working, but attends the [employer] au[employer]. He denies any tobacco, alcohol, or illicit drug use. He denies any radiation of pain, weakness, or numbness in his upper or lower extremities.     Review of Systems Pertinent Positive: Back pain, GERD, fatigue Pertienent Negative: fevers, chills, wt loss, dysuria, hematuria, numbness, saddle anesthesia, joint stiffness, myaglias. 11 point ROS was performed and is otherwise negative.  History reviewed. No pertinent past medical history. History   Social History  . Marital Status: Married    Spouse Name: N/A    Number of Children: N/A  . Years of Education: N/A   Occupational History  . Not on file.   Social History Main Topics  . Smoking status: Never Smoker   . Smokeless tobacco: Never Used  . Alcohol Use: No  . Drug Use: No  . Sexual Activity: Not on file   Other Topics Concern  . Not on file   Social History Narrative  . No narrative on file   History reviewed. No pertinent family history.     Objective:   Physical Exam BP 100/72  Pulse 73  Temp(Src) 98.2 F (36.8 C) (Oral)  Resp 16  Ht 5\' 10"  (1.778 m)  Wt 187 lb 6 oz (84.993 kg)  BMI 26.89 kg/m2  SpO2 100% General appearance: alert, cooperative and appears stated age Neck: no adenopathy, no carotid  bruit, no JVD, supple, symmetrical, trachea midline and thyroid not enlarged, symmetric, no tenderness/mass/nodules Back: Tenderness to palpation at L2-5 right paraspinal musculature. Tenderness to palpation at right T3-7 paraspinal musculature. No bony tenderness. No CVA tenderness. Lungs: clear to auscultation bilaterally and normal percussion bilaterally Chest wall: no tenderness Heart: regular rate and rhythm, S1, S2 normal, no murmur, click, rub or gallop Abdomen: normal findings: aorta normal, liver span normal to percussion, no masses palpable, no organomegaly, no scars, striae, dilated veins, rashes, or lesions and soft, non-tender and abnormal findings:  hypoactive bowel sounds Extremities: extremities normal, atraumatic, no cyanosis or edema Pulses: 2+ and symmetric Skin: Skin color, texture, turgor normal. No rashes or lesions Lymph nodes: Cervical, supraclavicular, and axillary nodes normal. Neurologic: Alert and oriented X 3, normal strength and tone. Normal symmetric reflexes. Normal coordination and gait Sensory: normal Motor: grossly normal Reflexes: 2+ and symmetric Coordination: normal Gait: Normal       Assessment & Plan:  1. Thoracolumbar back pain. -Appears to be musculoskeletal in origin, likely related to straining with constipation as well. No red flags on exam or history today. -Rx flexeril 5-10mg  po tid prn -Referral placed for formal physical therapy -Back stretches and exercises given to the patient today -Discussed reasons to call or go to the ED, including loss of bladder or bowels, numbness in the groin, inability to ambulate due to back  pain, fevers, chills, dysuria, or for any other concern. Pt verbalized understanding and agreement -We discussed establishing with a PCP, as his back pain may require more work-up if it persists, and having continuity with a provider would be more helpful for this condition.  2. Constipation -Rx Miralax with instructions  to titrate to bowel movements -Follow up if persists, blood in stools, worsening abdominal pain, or vomiting.  Tora Kindred, DO Sports Medicine Fellow

## 2013-10-28 ENCOUNTER — Ambulatory Visit: Attending: Sports Medicine | Admitting: Physical Therapy

## 2013-10-28 DIAGNOSIS — M546 Pain in thoracic spine: Secondary | ICD-10-CM | POA: Diagnosis not present

## 2013-10-28 DIAGNOSIS — M545 Low back pain, unspecified: Secondary | ICD-10-CM | POA: Insufficient documentation

## 2013-10-28 DIAGNOSIS — IMO0001 Reserved for inherently not codable concepts without codable children: Secondary | ICD-10-CM | POA: Diagnosis present

## 2013-10-28 NOTE — Progress Notes (Signed)
History and physical examinations discussed in detail with Dr. Joellyn HaffPick-Jacobs.  Agree with Assessment and Plan.

## 2013-11-10 ENCOUNTER — Encounter: Admitting: Physical Therapy

## 2013-11-12 ENCOUNTER — Encounter: Admitting: Rehabilitation

## 2013-11-18 ENCOUNTER — Encounter: Admitting: Physical Therapy

## 2013-11-20 ENCOUNTER — Encounter: Admitting: Rehabilitation

## 2014-07-09 ENCOUNTER — Emergency Department (HOSPITAL_BASED_OUTPATIENT_CLINIC_OR_DEPARTMENT_OTHER)
Admission: EM | Admit: 2014-07-09 | Discharge: 2014-07-10 | Disposition: A | Discharge status: Home / Self Care | Attending: Emergency Medicine | Admitting: Emergency Medicine

## 2014-07-09 ENCOUNTER — Encounter (HOSPITAL_BASED_OUTPATIENT_CLINIC_OR_DEPARTMENT_OTHER): Payer: Self-pay | Admitting: *Deleted

## 2014-07-09 DIAGNOSIS — J309 Allergic rhinitis, unspecified: Secondary | ICD-10-CM | POA: Insufficient documentation

## 2014-07-09 DIAGNOSIS — M549 Dorsalgia, unspecified: Secondary | ICD-10-CM | POA: Diagnosis present

## 2014-07-09 DIAGNOSIS — G8929 Other chronic pain: Secondary | ICD-10-CM

## 2014-07-09 DIAGNOSIS — M545 Low back pain, unspecified: Secondary | ICD-10-CM

## 2014-07-09 DIAGNOSIS — Z9109 Other allergy status, other than to drugs and biological substances: Secondary | ICD-10-CM

## 2014-07-09 MED ORDER — METHOCARBAMOL 500 MG PO TABS
500.0000 mg | ORAL_TABLET | Freq: Once | ORAL | Status: AC
  Administered 2014-07-09: 500 mg via ORAL

## 2014-07-09 MED ORDER — METHOCARBAMOL 500 MG PO TABS: 500.0000 mg | ORAL_TABLET | Freq: Two times a day (BID) | ORAL | Status: DC

## 2014-07-09 MED ORDER — LORATADINE 10 MG PO TABS
10.0000 mg | ORAL_TABLET | Freq: Once | ORAL | Status: AC
  Administered 2014-07-09: 10 mg via ORAL
  Filled 2014-07-09: qty 1

## 2014-07-09 MED ORDER — METHOCARBAMOL 500 MG PO TABS
ORAL_TABLET | ORAL | Status: AC
  Filled 2014-07-09: qty 1

## 2014-07-09 MED ORDER — KETOROLAC TROMETHAMINE 60 MG/2ML IM SOLN
60.0000 mg | Freq: Once | INTRAMUSCULAR | Status: AC
  Administered 2014-07-09: 60 mg via INTRAMUSCULAR
  Filled 2014-07-09: qty 2

## 2014-07-09 MED ORDER — FLUTICASONE PROPIONATE 50 MCG/ACT NA SUSP: 2.0000 | Freq: Every day | NASAL | Status: DC

## 2014-07-09 MED ORDER — MELOXICAM 7.5 MG PO TABS: 7.5000 mg | ORAL_TABLET | Freq: Every day | ORAL | Status: DC

## 2014-07-09 NOTE — ED Notes (Signed)
Pt reports back pain x 2 weeks.  Runny nose, itchy throat, itchy eyes x 2 weeks.  Reports that no medications that he can buy are working, states that he needs something IV

## 2014-07-09 NOTE — ED Provider Notes (Signed)
CSN: [id number]     Arrival date & time 07/09/14  2234 History  This chart was scribed for Jamiah Recore, MD by Phillis HaggisGabriella Gaje, ED Scribe. This patient was seen in room MH11/MH11 and patient care was started at 11:24 PM.   Chief Complaint  Patient presents with  . Back Pain   Patient is a 44 y.o. male presenting with back pain. The history is provided by the patient. No language interpreter was used.  Back Pain Location:  Sacro-iliac joint Quality:  Aching Radiates to:  Does not radiate Pain severity:  Moderate Pain is:  Same all the time Onset quality:  Gradual Timing:  Constant Progression:  Unchanged Chronicity:  Chronic (has been seen for this at various ED in the area for same) Context: not emotional stress, not falling, not MCA, not MVA, not physical stress and not recent illness   Relieved by:  Nothing Worsened by:  Nothing tried Ineffective treatments:  None tried Associated symptoms: no abdominal pain, no abdominal swelling, no bladder incontinence, no bowel incontinence, no chest pain, no dysuria, no fever, no leg pain, no numbness, no paresthesias, no pelvic pain, no perianal numbness, no tingling, no weakness and no weight loss   Risk factors: no hx of cancer     HPI Comments: Mikle BosworthMoustapha Flett is a 44 y.o. male who presents to the Emergency Department complaining of back pain that is acute on chronic as well as runny nose, itchy throat, and eyes  And other allergy symptoms onset two weeks ago. He states that he has taken tylenol to no relief. He states that he tried to move a treadmill but is unsure of any recent events or injury that could have caused this pain. He reports that he has not had a bowel movement recently.  But has not incontinence of bowels or bladder.  He states that nothing works for him pain wise and states that he needs something via IV.  He is resting comfortably in the room  History reviewed. No pertinent past medical history. Past Surgical History   Procedure Laterality Date  . Appendectomy     History reviewed. No pertinent family history. History  Substance Use Topics  . Smoking status: Never Smoker   . Smokeless tobacco: Never Used  . Alcohol Use: No    Review of Systems  Constitutional: Negative for fever and weight loss.  HENT: Positive for postnasal drip and rhinorrhea. Negative for congestion, drooling and facial swelling.   Eyes: Positive for itching. Negative for redness.  Cardiovascular: Negative for chest pain.  Gastrointestinal: Negative for abdominal pain and bowel incontinence.  Genitourinary: Negative for bladder incontinence, dysuria, difficulty urinating and pelvic pain.  Musculoskeletal: Positive for back pain.  Neurological: Negative for tingling, weakness, numbness and paresthesias.  All other systems reviewed and are negative.  Allergies  Review of patient's allergies indicates no known allergies.  Home Medications   Prior to Admission medications   Not on File   BP 122/59 mmHg  Pulse 93  Temp(Src) 98 F (36.7 C) (Oral)  Resp 22  Ht 5\' 9"  (1.753 m)  Wt 180 lb (81.647 kg)  BMI 26.57 kg/m2  SpO2 100%  Physical Exam  Constitutional: He is oriented to person, place, and time. He appears well-developed and well-nourished. No distress.  HENT:  Head: Normocephalic and atraumatic.  Mouth/Throat: Oropharynx is clear and moist.  Clear, colorless Postnasal drip and cobblestoning consistent with seasonal allergies.   Eyes: Conjunctivae and EOM are normal. Pupils are equal, round,  and reactive to light.  Neck: Normal range of motion. Neck supple. No tracheal deviation present.  Cardiovascular: Normal rate and regular rhythm.   Pulmonary/Chest: Effort normal and breath sounds normal. No stridor. No respiratory distress. He has no wheezes. He has no rales.  Abdominal: Soft. Bowel sounds are normal. There is no tenderness. There is no rebound and no guarding.  Stool palpable throughout the colon   Musculoskeletal: Normal range of motion. He exhibits no edema or tenderness.  No step offs or crepitus  Or point tenderness of c/t/L-spine. Points to the sacrum as the location of pain but this is non tender to the palpation  Lymphadenopathy:    He has no cervical adenopathy.  Neurological: He is alert and oriented to person, place, and time. He has normal reflexes.  Skin: Skin is warm and dry. He is not diaphoretic.  Psychiatric: He has a normal mood and affect.  Nursing note and vitals reviewed.   ED Course  Procedures (including critical care time) DIAGNOSTIC STUDIES: Oxygen Saturation is 100% on room air, normal by my interpretation.    COORDINATION OF CARE: 11:24 PM-Discussed treatment plan which includes toradol with pt at bedside and pt agreed to plan.   Labs Review Labs Reviewed - No data to display  Imaging Review No results found.   EKG Interpretation None      MDM   Final diagnoses:  None    Medications  ketorolac (TORADOL) injection 60 mg (60 mg Intramuscular Given 07/09/14 2338)  loratadine (CLARITIN) tablet 10 mg (10 mg Oral Given 07/09/14 2338)  methocarbamol (ROBAXIN) tablet 500 mg (500 mg Oral Given 07/09/14 2343)   Seasonal allergies and acute on chronic mechanical low back pain.  There is no indication for IV medications in this patient.  Will treat allergies with flonase and pain with Mobic and robaxin.    I personally performed the services described in this documentation, which was scribed in my presence. The recorded information has been reviewed and is accurate.     Cy Blamer, MD 07/10/14 [phone]

## 2014-07-09 NOTE — Discharge Instructions (Signed)
Allergies  Allergies may happen from anything your body is sensitive to. This may be food, medicines, pollens, chemicals, and many other things. Food allergies can be severe and deadly.  HOME CARE  If you do not know what causes a reaction, keep a diary. Write down the foods you ate and the symptoms that followed. Avoid foods that cause reactions.  If you have red raised spots (hives) or a rash:  Take medicine as told by your doctor.  Use medicines for red raised spots and itching as needed.  Apply cold cloths (compresses) to the skin. Take a cool bath. Avoid hot baths or showers.  If you are severely allergic:  It is often necessary to go to the hospital after you have treated your reaction.  Wear your medical alert jewelry.  You and your family must learn how to give a allergy shot or use an allergy kit (anaphylaxis kit).  Always carry your allergy kit or shot with you. Use this medicine as told by your doctor if a severe reaction is occurring. GET HELP RIGHT AWAY IF:  You have trouble breathing or are making high-pitched whistling sounds (wheezing).  You have a tight feeling in your chest or throat.  You have a puffy (swollen) mouth.  You have red raised spots, puffiness (swelling), or itching all over your body.  You have had a severe reaction that was helped by your allergy kit or shot. The reaction can return once the medicine has worn off.  You think you are having a food allergy. Symptoms most often happen within 30 minutes of eating a food.  Your symptoms have not gone away within 2 days or are getting worse.  You have new symptoms.  You want to retest yourself with a food or drink you think causes an allergic reaction. Only do this under the care of a doctor. MAKE SURE YOU:   Understand these instructions.  Will watch your condition.  Will get help right away if you are not doing well or get worse. Document Released: 06/24/2012 Document Reviewed:  06/24/2012 [facility] Patient Information 2015 [place]. This information is not intended to replace advice given to you by your health care provider. Make sure you discuss any questions you have with your health care provider.

## 2014-07-10 ENCOUNTER — Encounter (HOSPITAL_BASED_OUTPATIENT_CLINIC_OR_DEPARTMENT_OTHER): Payer: Self-pay | Admitting: Emergency Medicine

## 2014-11-01 IMAGING — CR DG ABDOMEN ACUTE W/ 1V CHEST
4 series · 4 of 4 positions shown · non-contrast
Comparison: 05/20/2012

CLINICAL DATA: Abdominal pain

EXAM:
ACUTE ABDOMEN SERIES (ABDOMEN 2 VIEW & CHEST 1 VIEW)

[w chest pa]
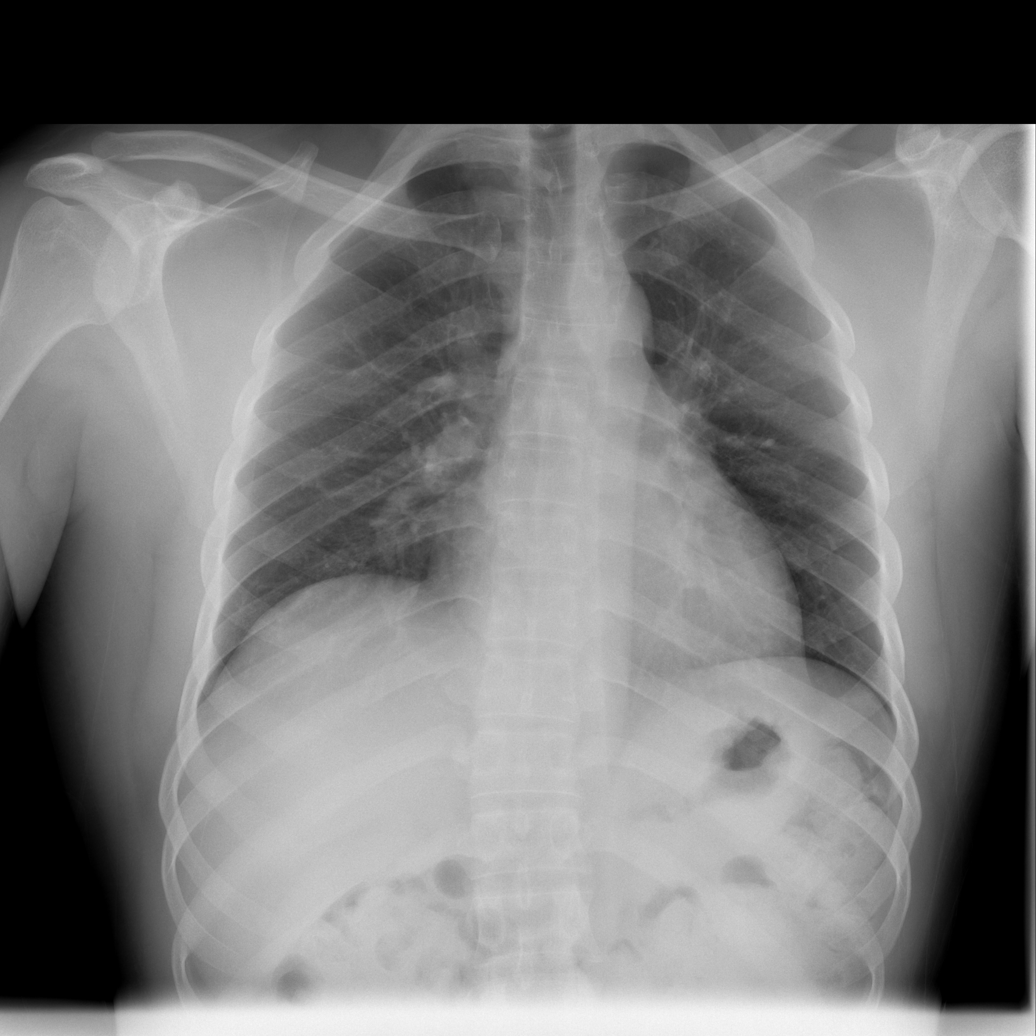

[w abdomen upright]
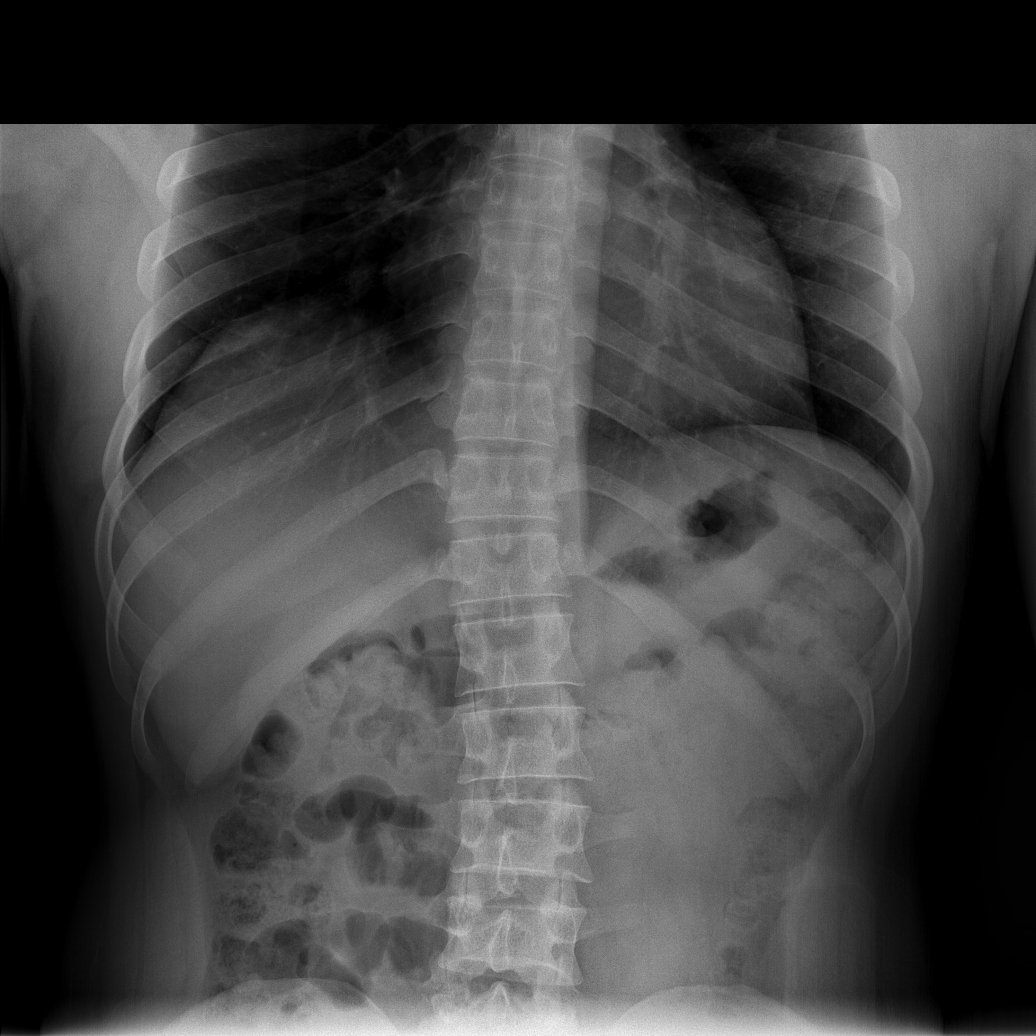

[t abdomen supine (1 of 2)]
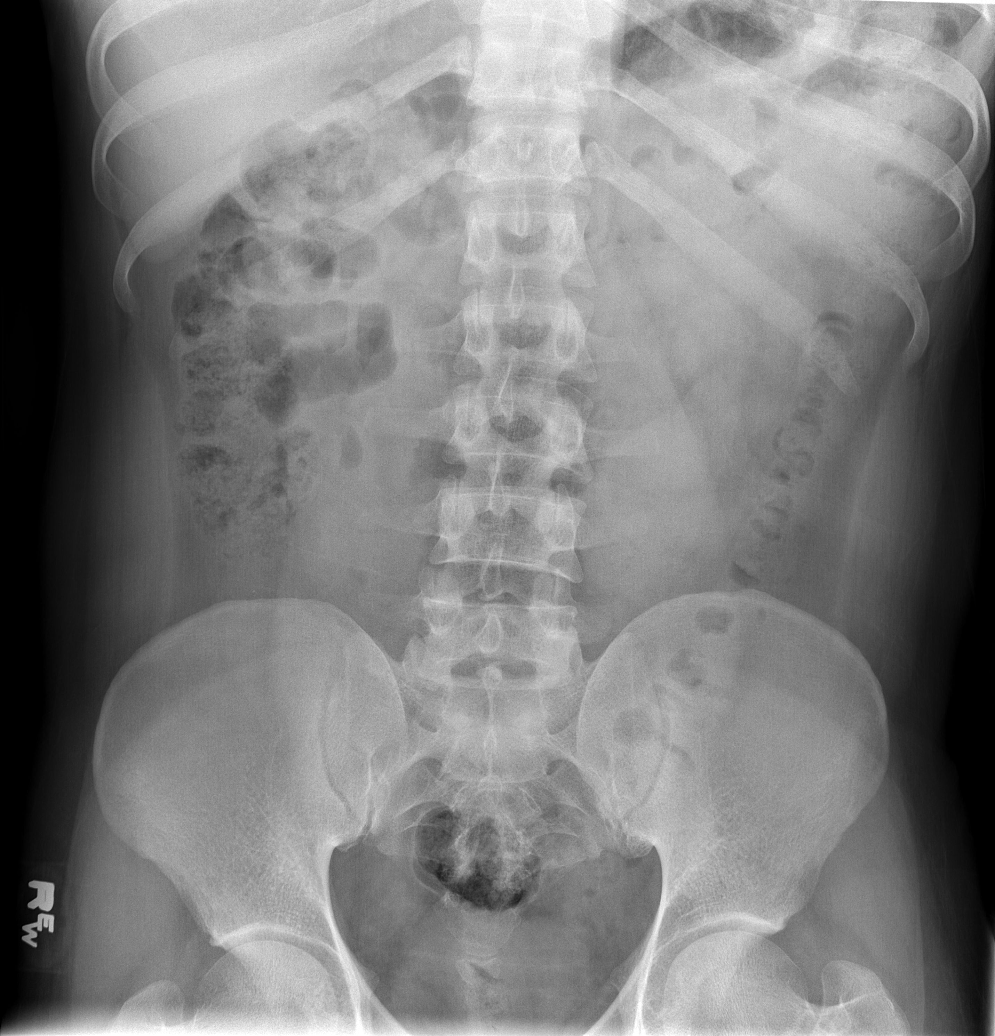

[t abdomen supine (2 of 2)]
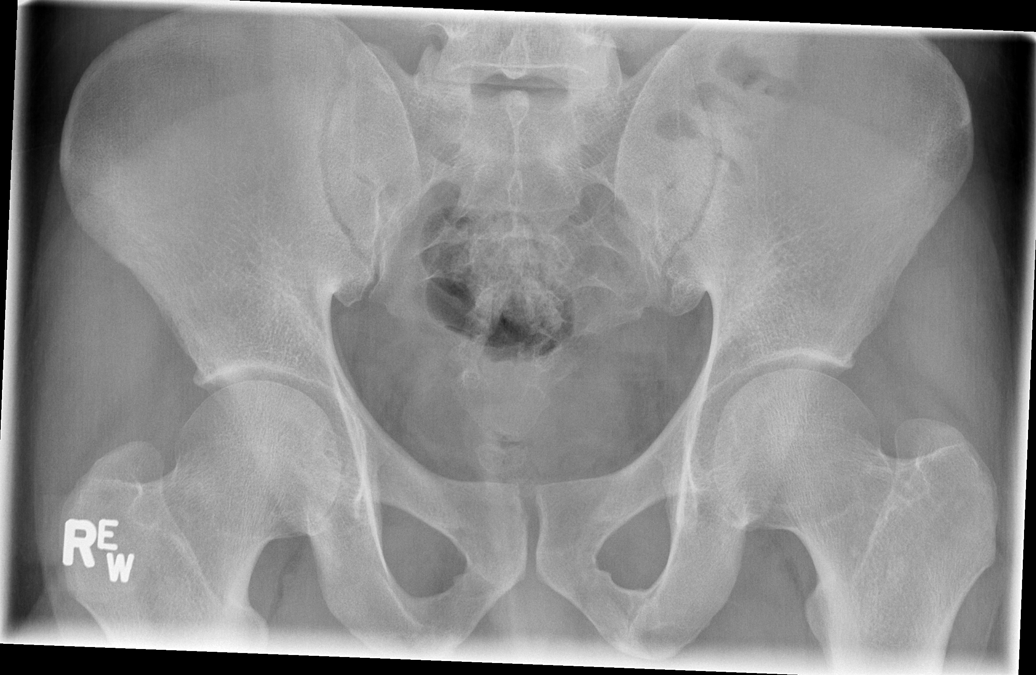

[4 of 4 positions shown; findings below may reference images not displayed]

FINDINGS: There is no evidence of dilated bowel loops or free intraperitoneal
air. No radiopaque calculi or other significant radiographic
abnormality is seen. Heart size and mediastinal contours are within
normal limits. Both lungs are clear. Mild lower thoracic and lumbar
scoliosis noted. No osseous abnormality.
IMPRESSION: Negative abdominal radiographs.  No acute cardiopulmonary disease.

## 2015-07-20 ENCOUNTER — Ambulatory Visit (INDEPENDENT_AMBULATORY_CARE_PROVIDER_SITE_OTHER): Admitting: Family Medicine

## 2015-07-20 ENCOUNTER — Ambulatory Visit

## 2015-07-20 VITALS — BP 114/72 | HR 69 | Temp 98.0°F | Resp 18 | Ht 69.0 in | Wt 193.0 lb

## 2015-07-20 DIAGNOSIS — R51 Headache: Secondary | ICD-10-CM

## 2015-07-20 DIAGNOSIS — G8929 Other chronic pain: Secondary | ICD-10-CM

## 2015-07-20 DIAGNOSIS — R519 Headache, unspecified: Secondary | ICD-10-CM

## 2015-07-20 DIAGNOSIS — Z23 Encounter for immunization: Secondary | ICD-10-CM

## 2015-07-20 DIAGNOSIS — M546 Pain in thoracic spine: Secondary | ICD-10-CM | POA: Diagnosis not present

## 2015-07-20 DIAGNOSIS — Z7189 Other specified counseling: Secondary | ICD-10-CM | POA: Diagnosis not present

## 2015-07-20 DIAGNOSIS — K2970 Gastritis, unspecified, without bleeding: Secondary | ICD-10-CM | POA: Diagnosis not present

## 2015-07-20 DIAGNOSIS — K2990 Gastroduodenitis, unspecified, without bleeding: Secondary | ICD-10-CM | POA: Diagnosis not present

## 2015-07-20 DIAGNOSIS — Z7184 Encounter for health counseling related to travel: Secondary | ICD-10-CM

## 2015-07-20 DIAGNOSIS — R5383 Other fatigue: Secondary | ICD-10-CM

## 2015-07-20 LAB — COMPREHENSIVE METABOLIC PANEL
ALT: 24 U/L (ref 9–46)
AST: 22 U/L (ref 10–40)
Albumin: 4.3 g/dL (ref 3.6–5.1)
Alkaline Phosphatase: 63 U/L (ref 40–115)
BUN: 17 mg/dL (ref 7–25)
CO2: 24 mmol/L (ref 20–31)
CREATININE: 0.9 mg/dL (ref 0.60–1.35)
Calcium: 9.3 mg/dL (ref 8.6–10.3)
Chloride: 103 mmol/L (ref 98–110)
GLUCOSE: 63 mg/dL — AB (ref 65–99)
POTASSIUM: 4.6 mmol/L (ref 3.5–5.3)
SODIUM: 137 mmol/L (ref 135–146)
Total Bilirubin: 0.7 mg/dL (ref 0.2–1.2)
Total Protein: 7.1 g/dL (ref 6.1–8.1)

## 2015-07-20 LAB — CBC
HCT: 45.8 % (ref 38.5–50.0)
Hemoglobin: 15.4 g/dL (ref 13.2–17.1)
MCH: 30.2 pg (ref 27.0–33.0)
MCHC: 33.6 g/dL (ref 32.0–36.0)
MCV: 89.8 fL (ref 80.0–100.0)
MPV: 9.5 fL (ref 7.5–12.5)
Platelets: 224 10*3/uL (ref 140–400)
RBC: 5.1 MIL/uL (ref 4.20–5.80)
RDW: 13.2 % (ref 11.0–15.0)
WBC: 3.7 10*3/uL — ABNORMAL LOW (ref 3.8–10.8)

## 2015-07-20 LAB — TSH: TSH: 2.38 m[IU]/L (ref 0.40–4.50)

## 2015-07-20 LAB — VITAMIN B12: Vitamin B-12: 539 pg/mL (ref 200–1100)

## 2015-07-20 MED ORDER — BUTALBITAL-APAP-CAFFEINE 50-325-40 MG PO TABS: 1.0000 | ORAL_TABLET | Freq: Four times a day (QID) | ORAL | Status: DC | PRN

## 2015-07-20 MED ORDER — TYPHOID VACCINE PO CPDR: 1.0000 | DELAYED_RELEASE_CAPSULE | ORAL | Status: DC

## 2015-07-20 MED ORDER — ATOVAQUONE-PROGUANIL HCL 250-100 MG PO TABS: 1.0000 | ORAL_TABLET | Freq: Every day | ORAL | Status: DC

## 2015-07-20 NOTE — Progress Notes (Signed)
By signing my name below I, Tereasa Coop, attest that this documentation has been prepared under the direction and in the presence of Delman Cheadle, MD. Electonically Signed. Tereasa Coop, Scribe 07/20/2015 at 3:57 PM   Subjective:    Patient ID: Melvin Medina, male    DOB: October 13, 1970, 45 y.o.   MRN: [id number]  Chief Complaint  Patient presents with  . Immunizations    Pt. is travelling to [place] next month and needs shots    HPI Melvin Medina is a 45 y.o. male who presents to the [facility] for immunizations for traveling to [place] in [place] next month. Pt states that he was there a year ago and was also born there. Pt bought a one way ticket and does not know when he will be returning to the [place].  Pt was taking doxycyline last time he went to [place] as a preventative measure against malaria. Pt developed and Ulcer and had to stop.  Pt c/o rt parathoracic pain. States he does not want medication for pain.  Pt believes he had the typhoid vaccine and is not sure when.  Pt also c/o mild HA.  Pt feels fatigued and weak during the first 30 minutes after waking up every morning, then is asymptomatic for the rest of the day.   Patient Active Problem List   Diagnosis Date Noted  . Back pain of thoracolumbar region 10/14/2013  . Unspecified constipation 10/14/2013  . GERD (gastroesophageal reflux disease) 10/14/2013   Current Outpatient Prescriptions on File Prior to Visit  Medication Sig Dispense Refill  . fluticasone (FLONASE) 50 MCG/ACT nasal spray Place 2 sprays into both nostrils daily. (Patient not taking: Reported on 07/20/2015) 16 g 0  . meloxicam (MOBIC) 7.5 MG tablet Take 1 tablet (7.5 mg total) by mouth daily. (Patient not taking: Reported on 07/20/2015) 7 tablet 0  . methocarbamol (ROBAXIN) 500 MG tablet Take 1 tablet (500 mg total) by mouth 2 (two) times daily. (Patient not taking: Reported on 07/20/2015) 20 tablet 0   No current facility-administered  medications on file prior to visit.   No Known Allergies   Depression screen PHQ 2/9 07/20/2015  Decreased Interest 0  Down, Depressed, Hopeless 0  PHQ - 2 Score 0    Immunization History  Administered Date(s) Administered  . Meningococcal Conjugate 07/20/2015  . Tdap 07/20/2015   Past Surgical History  Procedure Laterality Date  . Appendectomy     History reviewed. No pertinent family history. Social History   Social History  . Marital Status: Married    Spouse Name: N/A  . Number of Children: N/A  . Years of Education: N/A   Social History Main Topics  . Smoking status: Never Smoker   . Smokeless tobacco: Never Used  . Alcohol Use: No  . Drug Use: No  . Sexual Activity: Not Asked   Other Topics Concern  . None   Social History Narrative      Review of Systems  Constitutional: Negative for fever.  HENT: Negative for congestion.   Eyes: Negative for visual disturbance.  Respiratory: Negative for cough.   Cardiovascular: Negative for chest pain.  Gastrointestinal: Negative for abdominal pain.  Genitourinary: Negative for dysuria.  Musculoskeletal: Positive for back pain.  Skin: Negative for rash.  Neurological: Positive for headaches.  Psychiatric/Behavioral: Negative for agitation.       Objective:  BP 114/72 mmHg  Pulse 69  Temp(Src) 98 F (36.7 C) (Oral)  Resp 18  Ht  _0  (1.753 m)  Wt 193 lb (87.544 kg)  BMI 28.49 kg/m2  SpO2 99%  Physical Exam  Constitutional: He is oriented to person, place, and time. He appears well-developed and well-nourished. No distress.  HENT:  Head: Normocephalic and atraumatic.  Eyes: Conjunctivae are normal. Pupils are equal, round, and reactive to light.  Neck: Neck supple. No thyroid mass and no thyromegaly present.  Cardiovascular: Normal rate, regular rhythm and normal heart sounds.  Exam reveals no gallop.   No murmur heard. Pulmonary/Chest: Effort normal and breath sounds normal. He has no decreased breath  sounds. He has no wheezes. He has no rhonchi. He has no rales.  Musculoskeletal: Normal range of motion.       Thoracic back: He exhibits pain (c/o mild pain medial to rt scapula, non tender). He exhibits no tenderness (midline or paraspinal.), no deformity and no spasm.  Lymphadenopathy:    He has no cervical adenopathy.  Neurological: He is alert and oriented to person, place, and time. Gait normal.  Skin: Skin is warm and dry.  Psychiatric: He has a normal mood and affect. His behavior is normal.  Nursing note and vitals reviewed.        Assessment & Plan:  Pt's immunization records reviewed and scanned in: tdap 2007, will give tetanus booster. Had yellow fever vaccine 2012 - good for 10 yrs. Pt also needs meningitis vaccine and typhoid vaccine.   Pt has completed varicella, MMR, HepA, and HepB vaccines.   Rx for malaria prophylaxis.     1. Travel advice encounter - to [place] in 2 mos, unknown for how long  2. Gastritis and gastroduodenitis   3. Nonintractable episodic headache, unspecified headache type   4. Chronic right-sided thoracic back pain   5. Other fatigue     Orders Placed This Encounter  Procedures  . Tdap vaccine greater than or equal to 7yo IM  . Meningococcal conjugate vaccine 4-valent IM  . Vitamin B12  . TSH  . CBC  . Comprehensive metabolic panel    Meds ordered this encounter  Medications  . typhoid (VIVOTIF) DR capsule    Sig: Take 1 capsule by mouth every other day.    Dispense:  4 capsule    Refill:  0  . atovaquone-proguanil (MALARONE) 250-100 MG TABS tablet    Sig: Take 1 tablet by mouth daily. Start 1-2 d before the trip and 7days after.    Dispense:  180 tablet    Refill:  0  . butalbital-acetaminophen-caffeine (FIORICET) 50-325-40 MG tablet    Sig: Take 1-2 tablets by mouth every 6 (six) hours as needed for headache.    Dispense:  60 tablet    Refill:  0    I personally performed the services described in this documentation, which  was scribed in my presence. The recorded information has been reviewed and considered, and addended by me as needed.  Delman Cheadle, MD MPH

## 2015-07-20 NOTE — Patient Instructions (Addendum)
IF you received an x-ray today, you will receive an invoice from [facility]. Please contact [facility] at [phone] with questions or concerns regarding your invoice.   IF you received labwork today, you will receive an invoice from [employer]. Please contact [facility] at [phone] with questions or concerns regarding your invoice.   Our billing staff will not be able to assist you with questions regarding bills from these companies.  You will be contacted with the lab results as soon as they are available. The fastest way to get your results is to activate your My Chart account. Instructions are located on the last page of this paperwork. If you have not heard from [place] regarding the results in 2 weeks, please contact this office.    Remember to start the malaria pills 1-2d before you leave and can stop the 7d after you return.  You need a typhoid vaccine booster if it has been more than 5 years since your last immunization so take the oral vaccine more than 1 week before you travel.  Take in 1 hr before meal with cold or lukewarm drink.  Immunization Information for Foreign Travel Immunizations can protect you from certain diseases. Immunizations can also prevent the spread of certain infections. It is important to see your caregiver or a travel medicine specialist 4-6 weeks before you travel. This allows time for vaccines to take effect. It also provides enough time for you to get vaccines that must be given in a series over a period of days or weeks. Immunizations for travelers include:  Routine vaccines. These vaccines are standard for the people in a country.  Recommended vaccines. These vaccines are recommended before travel to some countries or regions.  Required vaccines. These vaccines are necessary before travel to specific countries or regions. If it is less than 4 weeks before you leave, you should still see your caregiver.  You might still benefit from vaccines or medicines. WHAT ARE THE ROUTINE VACCINES? Routine vaccines can protect you from diseases that are common in many parts of the world. Most routine vaccines are given at specific ages during your life. However, routine vaccines also include the annual flu (influenza) vaccine. You should be up to date on your routine immunizations before you travel. Your caregiver will be able to review your vaccine history and determine whether you have had all the routine vaccines. You may be advised to get extra doses or booster vaccines even if you are up to date on the routine vaccines. WHAT ARE THE RECOMMENDED VACCINES? Know your travel schedule when you visit your caregiver. The vaccines recommended before foreign travel will depend on several factors, including:  The country or countries of travel.  Whether you will travel to rural areas.  The length of time you will be traveling.  The season of the year.  Your age.  Your health status.  Your previous immunizations. Vaccine recommendations change over time. Your caregiver can tell you what vaccines are recommended before your trip. The annual influenza vaccine sometimes differs for the [place] and [place] hemispheres. Unless the annual vaccines are the same in both hemispheres, people with certain chronic medical conditions who are traveling to the other hemisphere shortly before or during the influenza season should also get the other influenza vaccine. The other influenza vaccine should be obtained either before leaving the country or shortly after arrival at the travel site. WHAT ARE THE REQUIRED VACCINES? Vaccines may be required during a  current outbreak of an infectious disease in a country or region. Your caregiver will be able to tell you about any current outbreaks and required vaccines. For example, proof of yellow fever immunization is currently required for most people before traveling to certain  countries in [place] and [place]. This vaccine can only be obtained at approved centers. You should get the yellow fever vaccine at least 10 days before your trip. After 10 days, most people show immunity to yellow fever. If it has been longer than 10 years since you received the yellow fever vaccine, another dose is required. If proof of immunization is incomplete or inaccurate, you could be quarantined, denied entry, or given another dose of vaccine at the travel site. If you cannot receive the yellow fever vaccine because of medical reasons, you must have a written statement from your caregiver. The statement must contain a medical reason for the lack of immunization. In such a case, your caregiver should then give you advice on how to decrease your chance of getting yellow fever. That advice should include taking precautions to avoid mosquito bites and limiting outdoor time. Other than having a medical condition or being under the age of 6 months, no other reasons will be accepted for not getting the vaccine.  Proof of meningococcal immunization is required by the [place] of [employer] for any person older than 2 years who is taking part in the [place] or [place]. Visas for traveling to the [place] or [place] will not even be issued until there is proof of immunization. You should get this vaccine at least 10 days before your trip. After 10 days, most people show immunity. If it has been longer than 3 years since your last immunization, another dose is required. FOR MORE INFORMATION  Centers for Disease Control and Prevention (CDC): FootballExhibition.com.brwww.cdc.gov  World Health Organization [facility](WHO): https://castaneda-walker.com/www.who.int   This information is not intended to replace advice given to you by your health care provider. Make sure you discuss any questions you have with your health care provider.   Document Released: 02/15/2009 Document Revised: 03/20/2014 Document Reviewed: 01/26/2012 Elsevier Interactive Patient Education [employer]2016  Elsevier Inc.

## 2015-07-25 ENCOUNTER — Encounter: Payer: Self-pay | Admitting: Family Medicine

## 2015-08-03 ENCOUNTER — Ambulatory Visit (INDEPENDENT_AMBULATORY_CARE_PROVIDER_SITE_OTHER): Admitting: Family Medicine

## 2015-08-03 VITALS — BP 104/66 | HR 70 | Temp 98.9°F | Resp 16 | Ht 69.0 in | Wt 189.0 lb

## 2015-08-03 DIAGNOSIS — R5383 Other fatigue: Secondary | ICD-10-CM | POA: Diagnosis not present

## 2015-08-03 MED ORDER — BUTALBITAL-APAP-CAFFEINE 50-325-40 MG PO TABS: 1.0000 | ORAL_TABLET | Freq: Four times a day (QID) | ORAL | Status: AC | PRN

## 2015-08-03 NOTE — Patient Instructions (Addendum)
Come in first thing in the morning - 8 a.m. To get your blood drawn one morning. Do not take any of your medicines or eat that morning. Wake up, drink some water, and head over. Tell the front desk you are here for a lab only visit.   IF you received an x-ray today, you will receive an invoice from [facility]. Please contact [facility] at [phone] with questions or concerns regarding your invoice.   IF you received labwork today, you will receive an invoice from [facility]. Please contact [facility] at [phone] with questions or concerns regarding your invoice.   Our billing staff will not be able to assist you with questions regarding bills from these companies.  You will be contacted with the lab results as soon as they are available. The fastest way to get your results is to activate your My Chart account. Instructions are located on the last page of this paperwork. If you have not heard from us regarding the results in 2 weeks, please contact this office.    I recommend getting a vitamin D supplement of 1000-2000u a day. Make sure you are getting about 1000mg  of calcium through your diet daily. A fish oil supplement to can help your joints, heart, and brain.  It is important to take an omega-3/fish oil supplement that is highest in the DHA and EPA types of omega-3 so look for those under the active ingredients and pick the one with the most during the next time you purchase supplements. Sometimes the the store brands will actually end up being the best for the least cost. Do not pay attention to the total mg listed on the front of the bottle.  You may also want to consider adding in flax seed and/or niacin to help increase your good cholesterol and protect your heart.  Curcumin is also supposed to help prevent joint and liver inflammation. Grape Seed oil is protective for the brain and heart. Vitamin E supplements may help protect the  brain.  Check out Dr. Tamala JulianAndrew Weil and the [facility] - both really dependable and excellent sources to get information on supplements, vitamins, and herbal products.

## 2015-08-03 NOTE — Progress Notes (Signed)
Subjective:  By signing my name below, I, Melvin Medina, attest that this documentation has been prepared under the direction and in the presence of Melvin Sorenson, MD. Electronically Signed: Stann Medina, Scribe. 08/03/2015 , 1:59 PM .  Patient was seen in Room 10 .   Patient ID: Melvin Medina, male    DOB: 06-30-1970, 45 y.o.   MRN: [id number] Chief Complaint  Patient presents with  . Follow-up    BLOODWRK  . Medication Problem    FOR OVERSEAS TRAVEL   HPI Melvin Medina is a 45 y.o. male who presents to [facility] for follow up on bloodwork discussion. Patient states he had taken his prescription for fioricet to the pharmacy but was rejected noting "that wasn't his prescription paper". He has heart burn after eating African food, and has found relief drinking apple vinegar. He has mild intolerance to milk, causing him to have bowel movement. He has calcium through his protein shakes.   He mentions asking about "medication to make his blood stronger".   No past medical history on file. Prior to Admission medications   Medication Sig Start Date End Date Taking? Authorizing Provider  atovaquone-proguanil (MALARONE) 250-100 MG TABS tablet Take 1 tablet by mouth daily. Start 1-2 d before the trip and 7days after. 07/20/15   Sherren Mocha, MD  butalbital-acetaminophen-caffeine (FIORICET) [phone] MG tablet Take 1-2 tablets by mouth every 6 (six) hours as needed for headache. 07/20/15 07/19/16  Sherren Mocha, MD  typhoid (VIVOTIF) DR capsule Take 1 capsule by mouth every other day. 07/20/15   Sherren Mocha, MD   No Known Allergies  Review of Systems  Constitutional: Negative for fatigue and unexpected weight change.  Eyes: Negative for visual disturbance.  Respiratory: Negative for cough, chest tightness and shortness of breath.   Cardiovascular: Negative for chest pain, palpitations and leg swelling.  Gastrointestinal: Negative for abdominal pain and blood in stool.  Neurological: Negative for  dizziness, light-headedness and headaches.       Objective:   Physical Exam  Constitutional: He is oriented to person, place, and time. He appears well-developed and well-nourished. No distress.  HENT:  Head: Normocephalic and atraumatic.  Eyes: EOM are normal. Pupils are equal, round, and reactive to light.  Neck: Neck supple.  Cardiovascular: Normal rate.   Pulmonary/Chest: Effort normal. No respiratory distress.  Musculoskeletal: Normal range of motion.  Neurological: He is alert and oriented to person, place, and time.  Skin: Skin is warm and dry.  Psychiatric: He has a normal mood and affect. His behavior is normal.  Nursing note and vitals reviewed.   BP 104/66 mmHg  Pulse 70  Temp(Src) 98.9 F (37.2 C) (Oral)  Resp 16  Ht  (1.753 m)  Wt 189 lb (85.73 kg)  BMI 27.90 kg/m2  SpO2 100%    Assessment & Plan:   1. Other fatigue   See AVS - pt req testosterone check so RTC for fasting labs first thing in a.m. Pt REALLY wanting recommendations on herbals, vitamins, supplements - he is very into health - likes to work out, very careful with diet. Has been taking some vitamins and protein supplements and likes them - wants to continue - but really pressing for my opinion on exactly what he might take to help his fatigue as well as protect internal organs. Wants to take a supp that can protect his kidneys, liver. Rec adequate ca/vit D supp. Cons omega-3 supp and/or flax seed, and niacin. Could cons circumin,  grape seed oil, and vit E as well.  Recommend [facility] or Dr. Lorin MercyAndrew Weils books if he would like to have resources to lean more about naturopathic regimens.  Orders Placed This Encounter  Procedures  . Lipid panel    Standing Status: Future     Number of Occurrences:      Standing Expiration Date: 08/02/2016    Order Specific Question:  Has the patient fasted?    Answer:  Yes  . Testosterone Total,Free,Bio, Males    Standing Status: Future     Number  of Occurrences:      Standing Expiration Date: 08/02/2016    Meds ordered this encounter  Medications  . butalbital-acetaminophen-caffeine (FIORICET) 50-325-40 MG tablet    Sig: Take 1-2 tablets by mouth every 6 (six) hours as needed for headache.    Dispense:  60 tablet    Refill:  0    I personally performed the services described in this documentation, which was scribed in my presence. The recorded information has been reviewed and considered, and addended by me as needed.   Melvin SorensonEva Pasquale Matters, M.D.  [facility]  [place] [address] [place], [place] [address] [phone] phone [phone] fax  08/16/2015 1:33 AM

## 2016-09-23 ENCOUNTER — Encounter (HOSPITAL_COMMUNITY): Payer: Self-pay | Admitting: Emergency Medicine

## 2016-09-23 ENCOUNTER — Emergency Department (HOSPITAL_COMMUNITY)

## 2016-09-23 ENCOUNTER — Other Ambulatory Visit: Payer: Self-pay

## 2016-09-23 ENCOUNTER — Emergency Department (HOSPITAL_COMMUNITY)
Admission: EM | Admit: 2016-09-23 | Discharge: 2016-09-24 | Disposition: A | Discharge status: Home / Self Care | Attending: Emergency Medicine | Admitting: Emergency Medicine

## 2016-09-23 DIAGNOSIS — R739 Hyperglycemia, unspecified: Secondary | ICD-10-CM | POA: Insufficient documentation

## 2016-09-23 DIAGNOSIS — R531 Weakness: Secondary | ICD-10-CM | POA: Insufficient documentation

## 2016-09-23 DIAGNOSIS — E860 Dehydration: Secondary | ICD-10-CM | POA: Diagnosis not present

## 2016-09-23 DIAGNOSIS — A419 Sepsis, unspecified organism: Secondary | ICD-10-CM | POA: Diagnosis not present

## 2016-09-23 HISTORY — DX: Gastrointestinal hemorrhage, unspecified: K92.2

## 2016-09-23 LAB — BASIC METABOLIC PANEL
ANION GAP: 11 (ref 5–15)
BUN: 13 mg/dL (ref 6–20)
CHLORIDE: 102 mmol/L (ref 101–111)
CO2: 22 mmol/L (ref 22–32)
Calcium: 8.9 mg/dL (ref 8.9–10.3)
Creatinine, Ser: 1.37 mg/dL — ABNORMAL HIGH (ref 0.61–1.24)
GFR calc Af Amer: >60 mL/min (ref >60)
GFR calc non Af Amer: >60 mL/min (ref >60)
GLUCOSE: 261 mg/dL — AB (ref 65–99)
POTASSIUM: 3.2 mmol/L — AB (ref 3.5–5.1)
Sodium: 135 mmol/L (ref 135–145)

## 2016-09-23 LAB — APTT: aPTT: 30 seconds (ref 24–36)

## 2016-09-23 LAB — HEPATIC FUNCTION PANEL
ALBUMIN: 4.2 g/dL (ref 3.5–5.0)
ALT: 20 U/L (ref 17–63)
AST: 23 U/L (ref 15–41)
Alkaline Phosphatase: 53 U/L (ref 38–126)
BILIRUBIN DIRECT: 0.2 mg/dL (ref 0.1–0.5)
Indirect Bilirubin: 1.2 mg/dL — ABNORMAL HIGH (ref 0.3–0.9)
Total Bilirubin: 1.4 mg/dL — ABNORMAL HIGH (ref 0.3–1.2)
Total Protein: 6.8 g/dL (ref 6.5–8.1)

## 2016-09-23 LAB — URINALYSIS, ROUTINE W REFLEX MICROSCOPIC
BACTERIA UA: NONE SEEN
BILIRUBIN URINE: NEGATIVE
Glucose, UA: 150 mg/dL — AB
Hgb urine dipstick: NEGATIVE
Ketones, ur: NEGATIVE mg/dL
Leukocytes, UA: NEGATIVE
Nitrite: NEGATIVE
PROTEIN: 30 mg/dL — AB
Specific Gravity, Urine: 1.023 (ref 1.005–1.030)
Squamous Epithelial / LPF: NONE SEEN
pH: 5 (ref 5.0–8.0)

## 2016-09-23 LAB — CBC
HCT: 41.3 % (ref 39.0–52.0)
HEMOGLOBIN: 14.3 g/dL (ref 13.0–17.0)
MCH: 29.7 pg (ref 26.0–34.0)
MCHC: 34.6 g/dL (ref 30.0–36.0)
MCV: 85.7 fL (ref 78.0–100.0)
Platelets: 255 10*3/uL (ref 150–400)
RBC: 4.82 MIL/uL (ref 4.22–5.81)
RDW: 12.8 % (ref 11.5–15.5)
WBC: 5.8 10*3/uL (ref 4.0–10.5)

## 2016-09-23 LAB — PROTIME-INR
INR: 1
Prothrombin Time: 13.2 seconds (ref 11.4–15.2)

## 2016-09-23 LAB — RAPID URINE DRUG SCREEN, HOSP PERFORMED
Amphetamines: NOT DETECTED
BENZODIAZEPINES: NOT DETECTED
Barbiturates: NOT DETECTED
Cocaine: NOT DETECTED
Opiates: NOT DETECTED
Tetrahydrocannabinol: POSITIVE — AB

## 2016-09-23 LAB — I-STAT CG4 LACTIC ACID, ED
Lactic Acid, Venous: 3.35 mmol/L (ref 0.5–1.9)
Lactic Acid, Venous: 1.94 mmol/L — ABNORMAL HIGH (ref 0.5–1.9)

## 2016-09-23 LAB — AMMONIA: AMMONIA: 19 umol/L (ref 9–35)

## 2016-09-23 MED ORDER — VANCOMYCIN HCL IN DEXTROSE 1-5 GM/200ML-% IV SOLN: 1000.0000 mg | Freq: Once | INTRAVENOUS | Status: DC

## 2016-09-23 MED ORDER — SODIUM CHLORIDE 0.9 % IV BOLUS (SEPSIS)
1000.0000 mL | Freq: Once | INTRAVENOUS | Status: AC
  Administered 2016-09-23: 1000 mL via INTRAVENOUS

## 2016-09-23 MED ORDER — PIPERACILLIN-TAZOBACTAM 3.375 G IVPB 30 MIN: 3.3750 g | Freq: Once | INTRAVENOUS | Status: DC

## 2016-09-23 MED ORDER — SODIUM CHLORIDE 0.9 % IV BOLUS (SEPSIS)
500.0000 mL | Freq: Once | INTRAVENOUS | Status: AC
  Administered 2016-09-23: 500 mL via INTRAVENOUS

## 2016-09-23 NOTE — ED Triage Notes (Signed)
Patient brought in by 3 bystanders from a mosque after pt told them he was sick and dizzy.  Pt states "I am sick and I am going to die." When asked to elaborate, patient stated "when it is your time, it's your time." Patient very vague about his complaints and insists that he is going to die, praying in triage. Patient reports waking up with generalized weakness, loss of appetite, and feeling sick and slow to think - sts he was confused at lunchtime. Patient denies any N/V, no fevers/chills. Pt repeating over and over that he is sick and going to heaven. Pt A&O x 4. Eyes bloodshot and tachy in triage. Patient appears very calm through this however, no signs of acute distress noted.

## 2016-09-23 NOTE — Discharge Instructions (Signed)
Tonight you treated for being coming overheated slight dehydration and weakness.  He was given fluids in the emergency department and felt better.  At time of discharge, but is also noted that your blood sugar was elevated.  It did not come back to total, normal by the time he was discharge after IV fluids.  I have asked the case manager to call you to make arrangements for you to be seen at one of our medical clinic for recheck. If you become weak and dizzy again in the next several days and have not been seen at a clinic.  Please return for further evaluation

## 2016-09-24 ENCOUNTER — Telehealth: Payer: Self-pay | Admitting: *Deleted

## 2016-09-24 NOTE — Telephone Encounter (Signed)
Called number of record and LM observing all HIPPA regulations, asking that pt call back to discuss follow up visit 09/23/2016. [phone]. Will await return call.

## 2016-09-25 LAB — CBG MONITORING, ED: Glucose-Capillary: 123 mg/dL — ABNORMAL HIGH (ref 65–99)

## 2016-09-26 ENCOUNTER — Ambulatory Visit (INDEPENDENT_AMBULATORY_CARE_PROVIDER_SITE_OTHER): Admitting: Family Medicine

## 2016-09-26 ENCOUNTER — Encounter: Payer: Self-pay | Admitting: Family Medicine

## 2016-09-26 VITALS — BP 106/69 | HR 70 | Temp 98.1°F | Resp 17 | Ht 67.0 in | Wt 187.0 lb

## 2016-09-26 DIAGNOSIS — R739 Hyperglycemia, unspecified: Secondary | ICD-10-CM

## 2016-09-26 DIAGNOSIS — Z125 Encounter for screening for malignant neoplasm of prostate: Secondary | ICD-10-CM

## 2016-09-26 DIAGNOSIS — N179 Acute kidney failure, unspecified: Secondary | ICD-10-CM

## 2016-09-26 DIAGNOSIS — R42 Dizziness and giddiness: Secondary | ICD-10-CM | POA: Diagnosis not present

## 2016-09-26 DIAGNOSIS — R369 Urethral discharge, unspecified: Secondary | ICD-10-CM

## 2016-09-26 DIAGNOSIS — R5383 Other fatigue: Secondary | ICD-10-CM

## 2016-09-26 DIAGNOSIS — R360 Urethral discharge without blood: Secondary | ICD-10-CM

## 2016-09-26 LAB — POCT GLYCOSYLATED HEMOGLOBIN (HGB A1C): HEMOGLOBIN A1C: 5.6

## 2016-09-26 LAB — POCT URINALYSIS DIP (MANUAL ENTRY)
BILIRUBIN UA: NEGATIVE
Glucose, UA: NEGATIVE mg/dL
Ketones, POC UA: NEGATIVE mg/dL
Leukocytes, UA: NEGATIVE
NITRITE UA: NEGATIVE
PH UA: 6.5 (ref 5.0–8.0)
Protein Ur, POC: NEGATIVE mg/dL
RBC UA: NEGATIVE
SPEC GRAV UA: 1.015 (ref 1.010–1.025)
UROBILINOGEN UA: 0.2 U/dL

## 2016-09-26 NOTE — Patient Instructions (Addendum)
   IF you received an x-ray today, you will receive an invoice from [facility]. Please contact [facility] at [phone] with questions or concerns regarding your invoice.   IF you received labwork today, you will receive an invoice from [facility]. Please contact [facility] at [phone] with questions or concerns regarding your invoice.   Our billing staff will not be able to assist you with questions regarding bills from these companies.  You will be contacted with the lab results as soon as they are available. The fastest way to get your results is to activate your My Chart account. Instructions are located on the last page of this paperwork. If you have not heard from us regarding the results in 2 weeks, please contact this office.     Dizziness Dizziness is a common problem. It is a feeling of unsteadiness or light-headedness. You may feel like you are about to faint. Dizziness can lead to injury if you stumble or fall. Anyone can become dizzy, but dizziness is more common in older adults. This condition can be caused by a number of things, including medicines, dehydration, or illness. Follow these instructions at home: Taking these steps may help with your condition: Eating and drinking  Drink enough fluid to keep your urine clear or pale yellow. This helps to keep you from becoming dehydrated. Try to drink more clear fluids, such as water.  Do not drink alcohol.  Limit your caffeine intake if directed by your health care provider.  Limit your salt intake if directed by your health care provider. Activity  Avoid making quick movements. ? Rise slowly from chairs and steady yourself until you feel okay. ? In the morning, first sit up on the side of the bed. When you feel okay, stand slowly while you hold onto something until you know that your balance is fine.  Move your legs often if you need to stand in one place for a long time. Tighten and relax your  muscles in your legs while you are standing.  Do not drive or operate heavy machinery if you feel dizzy.  Avoid bending down if you feel dizzy. Place items in your home so that they are easy for you to reach without leaning over. Lifestyle  Do not use any tobacco products, including cigarettes, chewing tobacco, or electronic cigarettes. If you need help quitting, ask your health care provider.  Try to reduce your stress level, such as with yoga or meditation. Talk with your health care provider if you need help. General instructions  Watch your dizziness for any changes.  Take medicines only as directed by your health care provider. Talk with your health care provider if you think that your dizziness is caused by a medicine that you are taking.  Tell a friend or a family member that you are feeling dizzy. If he or she notices any changes in your behavior, have this person call your health care provider.  Keep all follow-up visits as directed by your health care provider. This is important. Contact a health care provider if:  Your dizziness does not go away.  Your dizziness or light-headedness gets worse.  You feel nauseous.  You have reduced hearing.  You have new symptoms.  You are unsteady on your feet or you feel like the room is spinning. Get help right away if:  You vomit or have diarrhea and are unable to eat or drink anything.  You have problems talking, walking, swallowing, or using your arms,   hands, or legs.  You feel generally weak.  You are not thinking clearly or you have trouble forming sentences. It may take a friend or family member to notice this.  You have chest pain, abdominal pain, shortness of breath, or sweating.  Your vision changes.  You notice any bleeding.  You have a headache.  You have neck pain or a stiff neck.  You have a fever. This information is not intended to replace advice given to you by your health care provider. Make sure you  discuss any questions you have with your health care provider. Document Released: 08/23/2000 Document Revised: 08/05/2015 Document Reviewed: 02/23/2014 Elsevier Interactive Patient Education  2017 Elsevier Inc.  

## 2016-09-26 NOTE — Progress Notes (Signed)
Chief Complaint  Patient presents with  . Follow-up    dizziness     HPI Pre-syncope Patient [facility] and [facility] He states that he was given ivf and d/c from the ER He has occasional dizziness and is here today to follow up on why this is happening.  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 GI bleed     No current outpatient prescriptions on file.   No current facility-administered medications for this visit.     Allergies: No Known Allergies  Past Surgical History:  Procedure Laterality Date  . APPENDECTOMY      Social History   Social History  . Marital status: Married    Spouse name: N/A  . Number of children: N/A  . Years of education: N/A   Social History Main Topics  . Smoking status: Never Smoker  . Smokeless tobacco: Never Used  . Alcohol use No  . Drug use: No  . Sexual activity: No   Other Topics Concern  . None   Social History Narrative  . None    Review of Systems  Constitutional: Positive for malaise/fatigue. Negative for chills, fever and weight loss.  HENT: Negative for hearing loss, sore throat and tinnitus.   Eyes: Negative for blurred vision, double vision and photophobia.  Respiratory: Negative for cough, shortness of breath and wheezing.   Cardiovascular: Negative for chest pain, palpitations and leg swelling.  Gastrointestinal: Negative for abdominal pain, heartburn, nausea  and vomiting.  Genitourinary:       See hpi  Musculoskeletal: Negative for myalgias and neck pain.  Skin: Negative for itching and rash.  Neurological: Negative for dizziness, tingling and headaches.  Psychiatric/Behavioral: Negative for depression. The patient is not nervous/anxious and does not have insomnia.    See hpi  Objective: Vitals:   09/26/16 1155  BP: 106/69  Pulse: 70  Resp: 17  Temp: 98.1 F (36.7 C)  TempSrc: Oral  SpO2: 98%  Weight: 187 lb (84.8 kg)  Height: 5\' 7"  (1.702 m)    Physical Exam  Constitutional: He is oriented to person, place, and time. He appears well-developed and well-nourished.  HENT:  Head: Normocephalic and atraumatic.  Right Ear: External ear normal.  Left Ear: External ear normal.  Nose: Nose normal.  Mouth/Throat: Oropharynx is clear and moist.  Eyes: Conjunctivae and EOM are normal.  Neck: Normal range of motion. Neck supple.  Cardiovascular: Normal rate, regular rhythm, normal heart sounds and intact distal pulses.   Pulmonary/Chest: Effort normal and breath sounds normal. No respiratory distress. He has no wheezes. He has no rales.  Abdominal: Soft. Bowel sounds are normal. He exhibits no distension and no mass. There is no tenderness. There is no rebound and no guarding.  Genitourinary: Rectum normal and prostate normal. No penile tenderness.  Genitourinary Comments: Chaperone present  Musculoskeletal: Normal range of motion. He exhibits no edema.  Neurological:  He is alert and oriented to person, place, and time.  Skin: Skin is warm. Capillary refill takes less than 2 seconds. No erythema.  Psychiatric: He has a normal mood and affect. His behavior is normal. Judgment and thought content normal.    Assessment and Plan Melvin Medina was seen today for follow-up.  Diagnoses and all orders for this visit:  Dizziness and giddiness-  Likely due to dehydration  -     POCT glycosylated hemoglobin (Hb A1C) -     POCT urinalysis  dipstick  Hyperglycemia- sugars elevated on the ER labs Normal a1c today Glucosuria has resolved -     POCT glycosylated hemoglobin (Hb A1C) -     POCT urinalysis dipstick  Acute renal failure, unspecified acute renal failure type (HCC)- likely prerenal due to dehydration  Will check that creatinine has normalized -     Basic metabolic panel  Screening for prostate cancer  -     PSA  Penile discharge, without blood- no UTI noted on UA, normal UA Will check gc/ct Reassured pt that preejaculate is normal -     GC/Chlamydia Probe Amp  Fatigue, unspecified type -     VITAMIN D 25 Hydroxy (Vit-D Deficiency, Fractures)  Other orders -     Basic metabolic panel -     PSA  A total of 40 minutes were spent face-to-face with the patient during this encounter and over half of that time was spent on counseling and coordination of care.     Melvin Medina

## 2016-09-28 LAB — BASIC METABOLIC PANEL
BUN/Creatinine Ratio: 14 (ref 9–20)
BUN: 15 mg/dL (ref 6–24)
CO2: 25 mmol/L (ref 20–29)
CREATININE: 1.08 mg/dL (ref 0.76–1.27)
Calcium: 9.5 mg/dL (ref 8.7–10.2)
Chloride: 99 mmol/L (ref 96–106)
GFR calc non Af Amer: 81 mL/min/{1.73_m2} (ref >59)
GFR, EST AFRICAN AMERICAN: 94 mL/min/{1.73_m2} (ref >59)
GLUCOSE: 98 mg/dL (ref 65–99)
Potassium: 4.9 mmol/L (ref 3.5–5.2)
SODIUM: 138 mmol/L (ref 134–144)

## 2016-09-28 LAB — GC/CHLAMYDIA PROBE AMP
Chlamydia trachomatis, NAA: NEGATIVE
Neisseria gonorrhoeae by PCR: NEGATIVE

## 2016-09-28 LAB — CULTURE, BLOOD (ROUTINE X 2)
CULTURE: NO GROWTH
CULTURE: NO GROWTH
SPECIAL REQUESTS: ADEQUATE
Special Requests: ADEQUATE

## 2016-09-28 LAB — VITAMIN D 25 HYDROXY (VIT D DEFICIENCY, FRACTURES): VIT D 25 HYDROXY: 37.3 ng/mL (ref 30.0–100.0)

## 2016-09-28 LAB — PSA: PROSTATE SPECIFIC AG, SERUM: 0.6 ng/mL (ref 0.0–4.0)

## 2016-10-03 ENCOUNTER — Ambulatory Visit: Admitting: Family Medicine

## 2016-10-31 NOTE — ED Provider Notes (Signed)
[facility]-EMERGENCY DEPT Provider Note   CSN: [id number] Arrival date & time: 09/23/16  1900     History   Chief Complaint Chief Complaint  Patient presents with  . Weakness    generalized    HPI Melvin Medina is a 46 y.o. male.  Patient brought in by 3 bystanders from a [place] after pt told them he was sick and dizzy.  Pt states "I am sick and I am going to die." When asked to elaborate, patient stated "when it is your time, it's your time." Patient very vague about his complaints and insists that he is going to die, praying in triage. Patient reports waking up with generalized weakness, loss of appetite, and feeling sick and slow to think - sts he was confused at lunchtime. Patient denies any N/V, no fevers/chills. Pt repeating over and over that he is sick and going to heaven.       Past Medical History:  Diagnosis Date  . GI bleed     Patient Active Problem List   Diagnosis Date Noted  . Back pain of thoracolumbar region 10/14/2013  . Unspecified constipation 10/14/2013  . GERD (gastroesophageal reflux disease) 10/14/2013    Past Surgical History:  Procedure Laterality Date  . APPENDECTOMY         Home Medications    Prior to Admission medications   Not on File    Family History No family history on file.  Social History Social History  Substance Use Topics  . Smoking status: Never Smoker  . Smokeless tobacco: Never Used  . Alcohol use No     Allergies   Patient has no known allergies.   Review of Systems Review of Systems  HENT: Negative.   Gastrointestinal: Negative for nausea and vomiting.  Musculoskeletal: Positive for myalgias.  Neurological: Positive for weakness.  All other systems reviewed and are negative.    Physical Exam Updated Vital Signs BP 124/79   Pulse 72   Temp 98.4 F (36.9 C) (Oral)   Resp (!) 22   Ht 5\' 7"  (1.702 m)   Wt 81.6 kg (180 lb)   SpO2 99%   BMI 28.19 kg/m   Physical Exam  Constitutional: He  appears well-developed and well-nourished. He appears lethargic.  HENT:  Head: Normocephalic.  Eyes: Pupils are equal, round, and reactive to light.  Neck: Normal range of motion.  Cardiovascular: Normal rate.   Pulmonary/Chest: Effort normal.  Abdominal: Soft.  Musculoskeletal: Normal range of motion.  Neurological: He appears lethargic.  Skin: Skin is warm and dry. No rash noted.  Nursing note and vitals reviewed.    ED Treatments / Results  Labs (all labs ordered are listed, but only abnormal results are displayed) Labs Reviewed  BASIC METABOLIC PANEL - Abnormal; Notable for the following:       Result Value   Potassium 3.2 (*)    Glucose, Bld 261 (*)    Creatinine, Ser 1.37 (*)    All other components within normal limits  URINALYSIS, ROUTINE W REFLEX MICROSCOPIC - Abnormal; Notable for the following:    APPearance HAZY (*)    Glucose, UA 150 (*)    Protein, ur 30 (*)    All other components within normal limits  HEPATIC FUNCTION PANEL - Abnormal; Notable for the following:    Total Bilirubin 1.4 (*)    Indirect Bilirubin 1.2 (*)    All other components within normal limits  RAPID URINE DRUG SCREEN, HOSP PERFORMED - Abnormal; Notable for  the following:    Tetrahydrocannabinol POSITIVE (*)    All other components within normal limits  CBG MONITORING, ED - Abnormal; Notable for the following:    Glucose-Capillary 123 (*)    All other components within normal limits  I-STAT CG4 LACTIC ACID, ED - Abnormal; Notable for the following:    Lactic Acid, Venous 3.35 (*)    All other components within normal limits  I-STAT CG4 LACTIC ACID, ED - Abnormal; Notable for the following:    Lactic Acid, Venous 1.94 (*)    All other components within normal limits  CULTURE, BLOOD (ROUTINE X 2)  CULTURE, BLOOD (ROUTINE X 2)  CBC  AMMONIA  PROTIME-INR  APTT  CBG MONITORING, ED    EKG  EKG Interpretation  Date/Time:  Saturday September 23 2016 22:21:06 EDT Ventricular Rate:   85 PR Interval:  158 QRS Duration: 91 QT Interval:  362 QTC Calculation: 431 R Axis:   67 Text Interpretation:  Sinus rhythm ST elev, probable normal early repol pattern No acute changes Confirmed by Crista Curb [phone]) on 09/24/2016 10:26:39 AM       Radiology No results found.  Procedures Procedures (including critical care time)  Medications Ordered in ED Medications  sodium chloride 0.9 % bolus 1,000 mL (0 mLs Intravenous Stopped 09/23/16 2320)  sodium chloride 0.9 % bolus 1,000 mL (0 mLs Intravenous Stopped 09/23/16 2320)    And  sodium chloride 0.9 % bolus 1,000 mL (0 mLs Intravenous Stopped 09/23/16 2320)    And  sodium chloride 0.9 % bolus 500 mL (0 mLs Intravenous Stopped 09/24/16 0008)     Initial Impression / Assessment and Plan / ED Course  I have reviewed the triage vital signs and the nursing notes.  Pertinent labs & imaging results that were available during my care of the patient were reviewed by me and considered in my medical decision making (see chart for details).      Aggressively hydrated back to base line  Discussed need fro follow up care with PCP. Encouraged to stay hydrated   Final Clinical Impressions(s) / ED Diagnoses   Final diagnoses:  Sepsis (HCC)  Weakness  Dehydration  Generalized weakness  Hyperglycemia    New Prescriptions Discharge Medication List as of 09/23/2016 11:33 PM       Earley Favor, NP 10/31/16 1952    Nira Conn, MD 11/01/16 [phone]

## 2016-11-12 NOTE — Telephone Encounter (Signed)
[facility] will deliver [employer] to home today [facility] will [facility] for now but will be available should additional discharge needs arise or disposition change.

## 2017-10-05 ENCOUNTER — Other Ambulatory Visit: Payer: Self-pay

## 2017-10-05 ENCOUNTER — Emergency Department (HOSPITAL_COMMUNITY)
Admission: EM | Admit: 2017-10-05 | Discharge: 2017-10-06 | Disposition: A | Discharge status: Home / Self Care | Attending: Emergency Medicine | Admitting: Emergency Medicine

## 2017-10-05 ENCOUNTER — Encounter (HOSPITAL_COMMUNITY): Payer: Self-pay | Admitting: Emergency Medicine

## 2017-10-05 DIAGNOSIS — R5383 Other fatigue: Secondary | ICD-10-CM | POA: Insufficient documentation

## 2017-10-05 DIAGNOSIS — R5382 Chronic fatigue, unspecified: Secondary | ICD-10-CM

## 2017-10-05 LAB — BASIC METABOLIC PANEL
Anion gap: 9 (ref 5–15)
BUN: 9 mg/dL (ref 6–20)
CO2: 28 mmol/L (ref 22–32)
CREATININE: 1.21 mg/dL (ref 0.61–1.24)
Calcium: 9.7 mg/dL (ref 8.9–10.3)
Chloride: 99 mmol/L (ref 98–111)
GFR calc Af Amer: >60 mL/min (ref >60)
GFR calc non Af Amer: >60 mL/min (ref >60)
Glucose, Bld: 93 mg/dL (ref 70–99)
Potassium: 4.3 mmol/L (ref 3.5–5.1)
SODIUM: 136 mmol/L (ref 135–145)

## 2017-10-05 LAB — CBC
HCT: 48 % (ref 39.0–52.0)
Hemoglobin: 15.9 g/dL (ref 13.0–17.0)
MCH: 28.9 pg (ref 26.0–34.0)
MCHC: 33.1 g/dL (ref 30.0–36.0)
MCV: 87.3 fL (ref 78.0–100.0)
PLATELETS: 285 10*3/uL (ref 150–400)
RBC: 5.5 MIL/uL (ref 4.22–5.81)
RDW: 12.4 % (ref 11.5–15.5)
WBC: 3.9 10*3/uL — ABNORMAL LOW (ref 4.0–10.5)

## 2017-10-05 LAB — URINALYSIS, ROUTINE W REFLEX MICROSCOPIC
BILIRUBIN URINE: NEGATIVE
GLUCOSE, UA: NEGATIVE mg/dL
HGB URINE DIPSTICK: NEGATIVE
Ketones, ur: NEGATIVE mg/dL
Leukocytes, UA: NEGATIVE
Nitrite: NEGATIVE
Protein, ur: NEGATIVE mg/dL
SPECIFIC GRAVITY, URINE: 1.001 — AB (ref 1.005–1.030)
pH: 7 (ref 5.0–8.0)

## 2017-10-05 NOTE — ED Provider Notes (Signed)
Patient placed in Quick Look pathway, seen and evaluated   Chief Complaint: Fatigue, dehydration  HPI: Patient with no significant past medical history presents with 1 week history of feeling weak, lightheaded, thinking that he is dehydrated.  He states that his eyes and his throat were very dry.  He denies fevers, URI symptoms, chest pain, shortness of breath.  No nausea, vomiting, diarrhea, or abdominal pain.  No urinary symptoms.  He states that he is drinking a lot of water.  He states that something must be wrong because of the way he feels.  Per records, patient was [facility] at [facility] 10/[facility]/2017 and had an extensive work-up without acute findings.  He had a head CT at that time.  ROS:  Positive ROS: (+) Headache, fatigue Negative ROS: (-) Fevers, chest pain, shortness of breath, abdominal pain  Physical Exam:   Gen: No distress  Neuro: Awake and Alert  Skin: Warm    Focused Exam: Heart RRR, nml S1,S2, no m/r/g; Lungs CTAB; Abd soft, NT, no rebound or guarding; Ext 2+ pedal pulses bilaterally, no edema.  BP 121/80 (BP Location: Right Arm)   Pulse 72   Temp 98.8 F (37.1 C) (Oral)   Resp 18   Ht 5\' 7"  (1.702 m)   Wt 84.8 kg (187 lb)   SpO2 100%   BMI 29.29 kg/m   Plan: Will recheck basic lab work-up.  Initiation of care has begun. The patient has been counseled on the process, plan, and necessity for staying for the completion/evaluation, and the remainder of the medical screening examination    Desmond DikeGeiple, Asra Gambrel, PA-C 10/05/17 2004    Cathren LaineSteinl, Kevin, MD 10/05/17 2229

## 2017-10-05 NOTE — ED Triage Notes (Signed)
Reports feeling weak and dizzy for a week.  Seen by PCP.  Was told everything looked ok.

## 2017-10-06 ENCOUNTER — Encounter (HOSPITAL_COMMUNITY): Payer: Self-pay | Admitting: Emergency Medicine

## 2017-10-06 NOTE — ED Provider Notes (Signed)
[facility] EMERGENCY DEPARTMENT Provider Note   CSN: [id number] Arrival date & time: 10/05/17  1903     History   Chief Complaint Chief Complaint  Patient presents with  . Fatigue    HPI Melvin Medina is a 47 y.o. male.  The history is provided by the patient.  Illness  This is a chronic problem. The current episode started more than 1 week ago (since last year). The problem occurs constantly. The problem has not changed since onset.Pertinent negatives include no chest pain, no abdominal pain, no headaches and no shortness of breath. Nothing aggravates the symptoms. The symptoms are relieved by medications. He has tried nothing for the symptoms. The treatment provided no relief.  Feels too fatigued to get up at home.  Has been seen for same last summer and this week at [facility], with completely normal work ups and has been told nothing is wrong but he feels that there is and is demanding medication for this problem.  No f/c/r.  No CP, no SOB,  No focal weakness nor numbness, no changes in vision nor speech.  No n/v/d.  No rashes no trauma.  Has not seen a PMD for these problems only the EDs in multiple health care systems.    Past Medical History:  Diagnosis Date  . GI bleed     Patient Active Problem List   Diagnosis Date Noted  . Back pain of thoracolumbar region 10/14/2013  . Unspecified constipation 10/14/2013  . GERD (gastroesophageal reflux disease) 10/14/2013    Past Surgical History:  Procedure Laterality Date  . APPENDECTOMY          Home Medications    Prior to Admission medications   Not on File    Family History No family history on file.  Social History Social History   Tobacco Use  . Smoking status: Never Smoker  . Smokeless tobacco: Never Used  Substance Use Topics  . Alcohol use: No  . Drug use: No     Allergies   Patient has no known allergies.   Review of Systems Review of Systems  Constitutional: Positive  for fatigue. Negative for appetite change, diaphoresis, fever and unexpected weight change.  HENT: Negative for sore throat.   Eyes: Negative for visual disturbance.  Respiratory: Negative for cough, chest tightness and shortness of breath.   Cardiovascular: Negative for chest pain, palpitations and leg swelling.  Gastrointestinal: Negative for abdominal pain, constipation and diarrhea.  Musculoskeletal: Negative for neck pain and neck stiffness.  Skin: Negative for rash.  Neurological: Negative for tremors, syncope, facial asymmetry, speech difficulty, weakness, numbness and headaches.  All other systems reviewed and are negative.    Physical Exam Updated Vital Signs BP 121/90   Pulse (!) 53   Temp 98.8 F (37.1 C) (Oral)   Resp 16   Ht 5\' 7"  (1.702 m)   Wt 84.8 kg (187 lb)   SpO2 100%   BMI 29.29 kg/m   Physical Exam  Constitutional: He is oriented to person, place, and time. He appears well-developed and well-nourished. No distress.  HENT:  Head: Normocephalic and atraumatic.  Mouth/Throat: No oropharyngeal exudate.  Eyes: Pupils are equal, round, and reactive to light. Conjunctivae are normal.  Neck: Normal range of motion. Neck supple.  Cardiovascular: Normal rate, regular rhythm, normal heart sounds and intact distal pulses.  Pulmonary/Chest: Effort normal and breath sounds normal. No stridor. He has no wheezes. He has no rales.  Abdominal: Soft. Bowel sounds are  normal. He exhibits no mass. There is no tenderness. There is no rebound and no guarding.  Musculoskeletal: Normal range of motion. He exhibits no edema or tenderness.  Neurological: He is alert and oriented to person, place, and time. He displays normal reflexes.  Skin: Skin is warm and dry. Capillary refill takes less than 2 seconds.     ED Treatments / Results  Labs (all labs ordered are listed, but only abnormal results are displayed) Results for orders placed or performed during the hospital encounter  of 10/05/17  CBC  Result Value Ref Range   WBC 3.9 (L) 4.0 - 10.5 K/uL   RBC 5.50 4.22 - 5.81 MIL/uL   Hemoglobin 15.9 13.0 - 17.0 g/dL   HCT 16.148.0 09.639.0 - 04.552.0 %   MCV 87.3 78.0 - 100.0 fL   MCH 28.9 26.0 - 34.0 pg   MCHC 33.1 30.0 - 36.0 g/dL   RDW 40.912.4 81.111.5 - 91.415.5 %   Platelets 285 150 - 400 K/uL  Basic metabolic panel  Result Value Ref Range   Sodium 136 135 - 145 mmol/L   Potassium 4.3 3.5 - 5.1 mmol/L   Chloride 99 98 - 111 mmol/L   CO2 28 22 - 32 mmol/L   Glucose, Bld 93 70 - 99 mg/dL   BUN 9 6 - 20 mg/dL   Creatinine, Ser 7.821.21 0.61 - 1.24 mg/dL   Calcium 9.7 8.9 - 95.610.3 mg/dL   GFR calc non Af Amer >60 >60 mL/min   GFR calc Af Amer >60 >60 mL/min   Anion gap 9 5 - 15  Urinalysis, Routine w reflex microscopic  Result Value Ref Range   Color, Urine COLORLESS (A) YELLOW   APPearance CLEAR CLEAR   Specific Gravity, Urine 1.001 (L) 1.005 - 1.030   pH 7.0 5.0 - 8.0   Glucose, UA NEGATIVE NEGATIVE mg/dL   Hgb urine dipstick NEGATIVE NEGATIVE   Bilirubin Urine NEGATIVE NEGATIVE   Ketones, ur NEGATIVE NEGATIVE mg/dL   Protein, ur NEGATIVE NEGATIVE mg/dL   Nitrite NEGATIVE NEGATIVE   Leukocytes, UA NEGATIVE NEGATIVE   No results found.  None  Radiology No results found.  Procedures Procedures (including critical care time)   I have thoroughly reviewed labs,XRay EKG and CT from [facility] from 3 days ago.  They are all normal including TSH.  Patient has been seen for this in the past.  He has not seen a PMD.  EDP politely explained that the ED rules out life threatening conditions that require admission to the hospital, etc.  The patient has been evaluated for these conditions at our institution as well as [facility] and no such condition has been found.  EDP empathetically stated that I understand this is frustrating that the patient does not have an answr but the patient will need to follow up as an outpatient with a PMD in order to get his concerns better addressed.  He is  demanding medication for the ongoing fatigue.  I explained we do not have something that will cure this.  EDP planned walking and PO challenge, patient decided to leave.      Final Clinical Impressions(s) / ED Diagnoses   Final diagnoses:  Chronic fatigue   I suspect depression as a cause.  Patient needs to follow up with a PMD for ongoing care.  Work up has been extensive and has been normal.  See care everywhere.     Return for pain, numbness, changes in vision or speech, fevers >100.4  unrelieved by medication, shortness of breath, intractable vomiting, or diarrhea, abdominal pain, Inability to tolerate liquids or food, cough, altered mental status or any concerns. No signs of systemic illness or infection. The patient is nontoxic-appearing on exam and vital signs are within normal limits. Will refer to urology for microscopy hematuria as patient is asymptomatic.  I have reviewed the triage vital signs and the nursing notes. Pertinent labs &imaging results that were available during my care of the patient were reviewed by me and considered in my medical decision making (see chart for details).  After history, exam, and medical workup I feel the patient has been appropriately medically screened and is safe for discharge home. Pertinent diagnoses were discussed with the patient. Patient was given return precautions.   Jenkins Risdon, MD 10/06/17 [phone]

## 2017-10-06 NOTE — ED Notes (Signed)
ED Provider at bedside. 

## 2017-10-06 NOTE — ED Notes (Signed)
Pt discharged from ED; instructions provided; Pt encouraged to return to ED if symptoms worsen and to f/u with PCP; Pt verbalized understanding of all instructions 

## 2017-10-08 ENCOUNTER — Other Ambulatory Visit: Payer: Self-pay

## 2017-10-08 ENCOUNTER — Ambulatory Visit (INDEPENDENT_AMBULATORY_CARE_PROVIDER_SITE_OTHER): Payer: 59 | Admitting: Family Medicine

## 2017-10-08 ENCOUNTER — Encounter: Payer: Self-pay | Admitting: Family Medicine

## 2017-10-08 VITALS — BP 111/77 | HR 68 | Temp 99.0°F | Resp 17 | Ht 67.0 in | Wt 197.6 lb

## 2017-10-08 DIAGNOSIS — R5383 Other fatigue: Secondary | ICD-10-CM

## 2017-10-08 DIAGNOSIS — H538 Other visual disturbances: Secondary | ICD-10-CM

## 2017-10-08 DIAGNOSIS — R42 Dizziness and giddiness: Secondary | ICD-10-CM

## 2017-10-08 DIAGNOSIS — Z7184 Encounter for health counseling related to travel: Secondary | ICD-10-CM

## 2017-10-08 DIAGNOSIS — Z7189 Other specified counseling: Secondary | ICD-10-CM | POA: Diagnosis not present

## 2017-10-08 DIAGNOSIS — Z298 Encounter for other specified prophylactic measures: Secondary | ICD-10-CM

## 2017-10-08 MED ORDER — ATOVAQUONE-PROGUANIL HCL 250-100 MG PO TABS: 1.0000 | ORAL_TABLET | Freq: Every day | ORAL | 0 refills | Status: DC

## 2017-10-08 MED ORDER — MECLIZINE HCL 25 MG PO TABS: 25.0000 mg | ORAL_TABLET | Freq: Three times a day (TID) | ORAL | 0 refills | Status: DC | PRN

## 2017-10-08 NOTE — Patient Instructions (Addendum)
At the pharmacy pick up B-complex vitamin and a vitamin D supplement I have prescribed meclizine for dizziness  When you return follow up for a sleep study    IF you received an x-ray today, you will receive an invoice from [facility]. Please contact [facility] at [phone] with questions or concerns regarding your invoice.   IF you received labwork today, you will receive an invoice from [place]. Please contact [facility] at [phone] with questions or concerns regarding your invoice.   Our billing staff will not be able to assist you with questions regarding bills from these companies.  You will be contacted with the lab results as soon as they are available. The fastest way to get your results is to activate your My Chart account. Instructions are located on the last page of this paperwork. If you have not heard from [place] regarding the results in 2 weeks, please contact this office.     Malaria Malariais a disease that is caused by a type of single-celled germ that can live inside a person's body (parasite). The malaria parasite is from the Plasmodium family of parasites. This parasite can get into a person's blood when he or she is bitten by a certain type of mosquito (Anopheles mosquito). These mosquitoes are most common in tropical areas of the world. Usually, they are not found in the [place]. If you are bitten by an infected mosquito, the parasites can travel through your blood to your liver. The parasites mature in your liver, then they are released into your blood. They can then invade red blood cells. The parasites multiply inside red blood cells and cause the cells to break open (rupture). This infects more red blood cells. Losing red blood cells may cause you to have a low red blood cell count (anemia). What are the causes? This condition is caused by a Plasmodium parasite. Four different types of the parasite can cause disease in humans. Most cases  of malaria come from a mosquito bite, but the disease can also be passed from person to person through blood. What increases the risk? This condition is more likely to develop in:  People who live or travel in an area of the world where malaria is common.  People who receive donated blood (transfusion) or an organ transplant that is contaminated with infected blood cells.  People who share needles with a person who is infected with malaria.  Children.  Pregnant women.  People who have never been exposed to malaria parasites before. These people have not built up protection (immunity) against the parasites.  What are the signs or symptoms? Symptoms can vary depending on which parasite caused the infection. Symptoms usually come and go or occur in cycles. The first symptoms of this condition usually start 10 days to 4 weeks after the mosquito bite. Symptoms for all types of malaria usually happen in this order:  Chills, along with headache, muscle aches, fatigue, and nausea.  Fever, along with hot and dry skin.  Drenching sweats, along with weakness and exhaustion.  Other symptoms include:  Diarrhea or bloody stools (feces).  Yellowing of the skin and the whites of the eyes (jaundice).  Enlarged spleen or liver.  Severe symptoms include:  Trouble breathing.  Seizures.  Loss of consciousness (coma).  Bleeding.  How is this diagnosed? This condition may be diagnosed based on:  Your symptoms and medical history. Your health care provider may suspect malaria if you have been living or traveling in an  area where the disease is common.  A physical exam.  Blood tests to confirm the diagnosis. These may include: ? Examining a blood sample under a microscope. This is the most common way to diagnose malaria. Each type of parasite looks different under the microscope. Identifying which parasite is causing your infection will help your health care provider to determine which  medicines will work best. ? Complete blood count (CBC) to check for anemia.  How is this treated? This condition may be treated with many different medicines and combinations of medicines, often requiring treatment in the hospital. The best treatment for you will depend on:  Which type of parasite is causing your infection.  Whether various medicines are effective against it.  How you got the infection.  How severe your infection is.  Your age and your general health.  Follow these instructions at home:  Take over-the-counter and prescription medicines only as told by your health care provider.  Rest at home until your symptoms are under control.  Drink enough fluid to keep your urine clear or pale yellow.  Keep all follow-up visits as told by your health care provider. This is important. How is this prevented?  If you will be traveling to an area where malaria is common: ? Visit the website of the Centers for Disease Control and Prevention (CDC) to check your risk: KeyPreview.sewww.cdc.gov/malaria/travelers/index.html ? Make an appointment with your health care provider at least 2 weeks before you leave. You may be given a medicine to help prevent malaria.  You can also prevent malaria by: ? Using insect repellent. ? Staying indoors after daytime starts to turn to night or dusk. ? Wearing protective clothing that covers your arms and legs. ? Hanging a mosquito net over your bed. Contact a health care provider if:  You have an attack of malaria symptoms.  You develop jaundice. Get help right away if:  You have a seizure.  You have bleeding.  You have trouble breathing. This information is not intended to replace advice given to you by your health care provider. Make sure you discuss any questions you have with your health care provider. Document Released: 10/12/2003 Document Revised: 09/19/2015 Document Reviewed: 08/06/2015 Elsevier Interactive Patient Education  2018 [employer] Dizziness Dizziness is a common problem. It is a feeling of unsteadiness or light-headedness. You may feel like you are about to faint. Dizziness can lead to injury if you stumble or fall. Anyone can become dizzy, but dizziness is more common in older adults. This condition can be caused by a number of things, including medicines, dehydration, or illness. Follow these instructions at home: Eating and drinking  Drink enough fluid to keep your urine clear or pale yellow. This helps to keep you from becoming dehydrated. Try to drink more clear fluids, such as water.  Do not drink alcohol.  Limit your caffeine intake if told to do so by your health care provider. Check ingredients and nutrition facts to see if a food or beverage contains caffeine.  Limit your salt (sodium) intake if told to do so by your health care provider. Check ingredients and nutrition facts to see if a food or beverage contains sodium. Activity  Avoid making quick movements. ? Rise slowly from chairs and steady yourself until you feel okay. ? In the morning, first sit up on the side of the bed. When you feel okay, stand slowly while you hold onto something until you know that your balance is fine.  If  you need to stand in one place for a long time, move your legs often. Tighten and relax the muscles in your legs while you are standing.  Do not drive or use heavy machinery if you feel dizzy.  Avoid bending down if you feel dizzy. Place items in your home so that they are easy for you to reach without leaning over. Lifestyle  Do not use any products that contain nicotine or tobacco, such as cigarettes and e-cigarettes. If you need help quitting, ask your health care provider.  Try to reduce your stress level by using methods such as yoga or meditation. Talk with your health care provider if you need help to manage your stress. General instructions  Watch your dizziness for any changes.  Take over-the-counter and  prescription medicines only as told by your health care provider. Talk with your health care provider if you think that your dizziness is caused by a medicine that you are taking.  Tell a friend or a family member that you are feeling dizzy. If he or she notices any changes in your behavior, have this person call your health care provider.  Keep all follow-up visits as told by your health care provider. This is important. Contact a health care provider if:  Your dizziness does not go away.  Your dizziness or light-headedness gets worse.  You feel nauseous.  You have reduced hearing.  You have new symptoms.  You are unsteady on your feet or you feel like the room is spinning. Get help right away if:  You vomit or have diarrhea and are unable to eat or drink anything.  You have problems talking, walking, swallowing, or using your arms, hands, or legs.  You feel generally weak.  You are not thinking clearly or you have trouble forming sentences. It may take a friend or family member to notice this.  You have chest pain, abdominal pain, shortness of breath, or sweating.  Your vision changes.  You have any bleeding.  You have a severe headache.  You have neck pain or a stiff neck.  You have a fever. These symptoms may represent a serious problem that is an emergency. Do not wait to see if the symptoms will go away. Get medical help right away. Call your local emergency services (911 in the [place] Do not drive yourself to the hospital. Summary  Dizziness is a feeling of unsteadiness or light-headedness. This condition can be caused by a number of things, including medicines, dehydration, or illness.  Anyone can become dizzy, but dizziness is more common in older adults.  Drink enough fluid to keep your urine clear or pale yellow. Do not drink alcohol.  Avoid making quick movements if you feel dizzy. Monitor your dizziness for any changes. This information is not intended to  replace advice given to you by your health care provider. Make sure you discuss any questions you have with your health care provider. Document Released: 08/23/2000 Document Revised: 04/01/2016 Document Reviewed: 04/01/2016 Elsevier Interactive Patient Education  [employer].

## 2017-10-08 NOTE — Progress Notes (Signed)
Chief Complaint  Patient presents with  . Establish Care     eseen in ED on 10/05/17 for fatigue.  Pt concerns about diabetes, thyroid, liver, sleep apnea, vision concerns, fatigue, hypoglycemia and dizziness.  Onset of symptom for about a week.       HPI  Pt reports that he has been feeling dizzy and tired He reports that he was [place] going to [place] He reports that he is drinking plenty of water but was a little dehydrated before this all started He reports that he drinks water and is up at night urinating He has not had an eye exam and also reports blurry vision He is a vegetarian and takes some multivitamins He states that he worries about passing out on his flight [facility] and [facility] He denies alcohol consumption He does not smoke   EKG 10/02/17 care everywhere Diagnosis Class Normal Acquisition Device D3K Ventricular Rate 71 Atrial Rate 71 P-R Interval 200 QRS Duration 86 Q-T Interval 358 QTC Calculation(Bazett) 389 Calculated P Axis 43 Calculated R Axis 72 Calculated T Axis 43   Normal sinus rhythm Normal Rate Normal axis Normal ECG When compared with ECG of 03-Oct-2016 20:06, No significant change was found Erin HearingHalsey, Michael ([id number]) on 10/02/2017 11:02:21 PM certifies that he/she has reviewed the ECG tracing and confirms theindependent interpretation is correct.  IMPRESSION:   1.No acute intracranial abnormality is identified.  Electronically Signed by: Lambert Ketoichard Sanchez  Result Narrative    INDICATION:headache  TECHNIQUE: CT HEAD WITHOUT IV CONTRAST  FINDINGS:   There is no midline shift, intracranial hemorrhage, or acute mass effect. No abnormal extra-axial fluid collections are seen. The ventricles, cisterns, and sulci are unremarkable.  The visualized orbits are unremarkable. The visualized paranasal sinuses are clear. The mastoid air cells are clear.  No displaced or depressed calvarial  fracture is identified.  Other Result Information  Acute Interface, Incoming Rad Results - 10/02/2017 10:27 PM EDT   INDICATION:headache  TECHNIQUE: CT HEAD WITHOUT IV CONTRAST  FINDINGS:   There is no midline shift, intracranial hemorrhage, or acute mass effect. No abnormal extra-axial fluid collections are seen. The ventricles, cisterns, and sulci are unremarkable.  The visualized orbits are unremarkable. The visualized paranasal sinuses are clear. The mastoid air cells are clear.  No displaced or depressed calvarial fracture is identified.   IMPRESSION:   1.  No acute intracranial abnormality is identified.  Electronically Signed by: Lambert Ketoichard Sanchez   Result Impression  IMPRESSION:   1.No acute cardiopulmonary disease identified.  Electronically Signed by: Lambert Ketoichard Sanchez  Result Narrative    TECHNIQUE:XR CHEST AP PORTABLE  INDICATION: Weakness  FINDINGS:  The cardiomediastinal silhouette is within normal limits. The pulmonary vasculature is within normal limits.No suspicious pulmonary infiltrate is identified. There are no obvious pleural effusions. No pneumothorax is identified.   Other Result Information  Acute Interface, Incoming Rad Results - 10/02/2017  9:38 PM EDT   TECHNIQUE:  XR CHEST AP PORTABLE  INDICATION: Weakness  FINDINGS:  The cardiomediastinal silhouette is within normal limits. The pulmonary vasculature is within normal limits.  No suspicious pulmonary infiltrate is identified. There are no obvious pleural effusions. No pneumothorax is identified.    IMPRESSION:   1.  No acute cardiopulmonary disease identified.  Electronically Signed by: Lambert Ketoichard Sanchez     Past Medical History:  Diagnosis Date  . GI bleed     Current Outpatient Medications  Medication Sig Dispense Refill  .  atovaquone-proguanil (MALARONE) 250-100 MG TABS tablet Take 1 tablet by mouth daily. 120 tablet 0  . meclizine (ANTIVERT) 25 MG tablet Take 1 tablet  (25 mg total) by mouth 3 (three) times daily as needed for dizziness. 30 tablet 0   No current facility-administered medications for this visit.     Allergies: No Known Allergies  Past Surgical History:  Procedure Laterality Date  . APPENDECTOMY      Social History   Socioeconomic History  . Marital status: Married    Spouse name: Not on file  . Number of children: Not on file  . Years of education: Not on file  . Highest education level: Not on file  Occupational History  . Not on file  Social Needs  . Financial resource strain: Not on file  . Food insecurity:    Worry: Not on file    Inability: Not on file  . Transportation needs:    Medical: Not on file    Non-medical: Not on file  Tobacco Use  . Smoking status: Never Smoker  . Smokeless tobacco: Never Used  Substance and Sexual Activity  . Alcohol use: No  . Drug use: No  . Sexual activity: Never  Lifestyle  . Physical activity:    Days per week: Not on file    Minutes per session: Not on file  . Stress: Not on file  Relationships  . Social connections:    Talks on phone: Not on file    Gets together: Not on file    Attends religious service: Not on file    Active member of club or organization: Not on file    Attends meetings of clubs or organizations: Not on file    Relationship status: Not on file  Other Topics Concern  . Not on file  Social History Narrative  . Not on file    No family history on file.   ROS Review of Systems See HPI Constitution: No fevers or chills No malaise No diaphoresis Skin: No rash or itching Eyes: see hpi, no conjunctival injection or eye pain GU: no dysuria or hematuria Neuro: see hpi, no tremors or slurred speech all others reviewed and negative   Objective: Vitals:   10/08/17 1421  BP: 111/77  Pulse: 68  Resp: 17  Temp: 99 F (37.2 C)  TempSrc: Oral  SpO2: 98%  Weight: 197 lb 9.6 oz (89.6 kg)  Height: 5\' 7"  (1.702 m)    Physical Exam    Constitutional: He is oriented to person, place, and time. He appears well-developed and well-nourished.  HENT:  Head: Normocephalic and atraumatic.  Right Ear: External ear normal.  Left Ear: External ear normal.  Mouth/Throat: Oropharynx is clear and moist.  Eyes: Pupils are equal, round, and reactive to light. Conjunctivae and EOM are normal.  Normal fundoscopic exam bilaterally  Neck: Normal range of motion. Neck supple.  Cardiovascular: Normal rate, regular rhythm and normal heart sounds.  No murmur heard. Pulmonary/Chest: Effort normal and breath sounds normal. No stridor. No respiratory distress. He has no wheezes. He has no rales.  Abdominal: Soft. Bowel sounds are normal. He exhibits no distension and no mass. There is no tenderness. There is no rebound and no guarding. No hernia.  Neurological: He is alert and oriented to person, place, and time.  Skin: Skin is warm. Capillary refill takes less than 2 seconds.  Psychiatric: He has a normal mood and affect. His behavior is normal. Judgment and thought content normal.  Component     Latest Ref Rng & Units 10/05/2017  Sodium     135 - 145 mmol/L 136  Potassium     3.5 - 5.1 mmol/L 4.3  Chloride     98 - 111 mmol/L 99  CO2     22 - 32 mmol/L 28  Glucose     70 - 99 mg/dL 93  BUN     6 - 20 mg/dL 9  Creatinine     2.13 - 1.24 mg/dL 0.86  Calcium     8.9 - 10.3 mg/dL 9.7  GFR, Est Non African American     >60 mL/min >60  GFR, Est African American     >60 mL/min >60  Anion gap     5 - 15 9  WBC     4.0 - 10.5 K/uL 3.9 (L)  RBC     4.22 - 5.81 MIL/uL 5.50  Hemoglobin     13.0 - 17.0 g/dL 57.8  HCT     46.9 - 62.9 % 48.0  MCV     78.0 - 100.0 fL 87.3  MCH     26.0 - 34.0 pg 28.9  MCHC     30.0 - 36.0 g/dL 52.8  RDW     41.3 - 24.4 % 12.4  Platelets     150 - 400 K/uL 285   Assessment and Plan Cristan was seen today for establish care.  Diagnoses and all orders for this visit:  Dizziness-  reviewed ER visits Discussed CT and ekg Advised meclizine, hydration and precautions when traveling -     meclizine (ANTIVERT) 25 MG tablet; Take 1 tablet (25 mg total) by mouth 3 (three) times daily as needed for dizziness.  Travel advice encounter- discussed malarone for malaria prophylaxis -     atovaquone-proguanil (MALARONE) 250-100 MG TABS tablet; Take 1 tablet by mouth daily.  Fatigue, unspecified type- will plan to order a sleep study when the patient returns from [place]  Need for malaria prophylaxis -     atovaquone-proguanil (MALARONE) 250-100 MG TABS tablet; Take 1 tablet by mouth daily.  Blurry vision- pt will get eye exam in [place]   A total of 30 minutes were spent face-to-face with the patient during this encounter and over half of that time was spent on counseling and coordination of care.      Daniela Hernan A Naia Ruff

## 2018-02-04 ENCOUNTER — Encounter (HOSPITAL_COMMUNITY): Payer: Self-pay | Admitting: Family Medicine

## 2018-02-04 DIAGNOSIS — R5383 Other fatigue: Secondary | ICD-10-CM | POA: Diagnosis not present

## 2018-02-04 DIAGNOSIS — R140 Abdominal distension (gaseous): Secondary | ICD-10-CM | POA: Insufficient documentation

## 2018-02-04 DIAGNOSIS — R7989 Other specified abnormal findings of blood chemistry: Secondary | ICD-10-CM | POA: Diagnosis not present

## 2018-02-04 NOTE — ED Notes (Signed)
Bed: [id number] Expected date:  Expected time:  Means of arrival:  Comments: 47 yr old back and flank pain

## 2018-02-04 NOTE — ED Triage Notes (Signed)
Patient is complaining of left  abd pain with bloating, bilateral eye pain (denies headache), fatigue, and dizziness. Abd pain started 2 weeks, eye pain started 2 weeks ago, fatigue started about 1 month ago and dizziness started since last summer. Patient reports he has tried taking Vitamin B, and detox supplements to help relieve his symptoms.

## 2018-02-05 ENCOUNTER — Emergency Department (HOSPITAL_COMMUNITY)
Admission: EM | Admit: 2018-02-05 | Discharge: 2018-02-05 | Disposition: A | Discharge status: Home / Self Care | Attending: Emergency Medicine | Admitting: Emergency Medicine

## 2018-02-05 ENCOUNTER — Emergency Department (HOSPITAL_COMMUNITY)

## 2018-02-05 ENCOUNTER — Other Ambulatory Visit: Payer: Self-pay

## 2018-02-05 DIAGNOSIS — R7989 Other specified abnormal findings of blood chemistry: Secondary | ICD-10-CM

## 2018-02-05 DIAGNOSIS — R140 Abdominal distension (gaseous): Secondary | ICD-10-CM

## 2018-02-05 DIAGNOSIS — R5383 Other fatigue: Secondary | ICD-10-CM

## 2018-02-05 LAB — CBC
HEMATOCRIT: 45.2 % (ref 39.0–52.0)
HEMOGLOBIN: 14.8 g/dL (ref 13.0–17.0)
MCH: 29.2 pg (ref 26.0–34.0)
MCHC: 32.7 g/dL (ref 30.0–36.0)
MCV: 89.2 fL (ref 80.0–100.0)
Platelets: 280 10*3/uL (ref 150–400)
RBC: 5.07 MIL/uL (ref 4.22–5.81)
RDW: 12.4 % (ref 11.5–15.5)
WBC: 5.5 10*3/uL (ref 4.0–10.5)
nRBC: 0 % (ref 0.0–0.2)

## 2018-02-05 LAB — URINALYSIS, ROUTINE W REFLEX MICROSCOPIC
BACTERIA UA: NONE SEEN
Bilirubin Urine: NEGATIVE
Glucose, UA: NEGATIVE mg/dL
Hgb urine dipstick: NEGATIVE
Ketones, ur: NEGATIVE mg/dL
LEUKOCYTES UA: NEGATIVE
NITRITE: NEGATIVE
PROTEIN: NEGATIVE mg/dL
SPECIFIC GRAVITY, URINE: 1.003 — AB (ref 1.005–1.030)
pH: 6 (ref 5.0–8.0)

## 2018-02-05 LAB — TSH: TSH: 5.565 u[IU]/mL — AB (ref 0.350–4.500)

## 2018-02-05 LAB — COMPREHENSIVE METABOLIC PANEL
ALBUMIN: 4.4 g/dL (ref 3.5–5.0)
ALK PHOS: 62 U/L (ref 38–126)
ALT: 32 U/L (ref 0–44)
AST: 30 U/L (ref 15–41)
Anion gap: 5 (ref 5–15)
BILIRUBIN TOTAL: 0.9 mg/dL (ref 0.3–1.2)
BUN: 16 mg/dL (ref 6–20)
CALCIUM: 9.3 mg/dL (ref 8.9–10.3)
CO2: 28 mmol/L (ref 22–32)
Chloride: 104 mmol/L (ref 98–111)
Creatinine, Ser: 1.06 mg/dL (ref 0.61–1.24)
GFR calc Af Amer: >60 mL/min (ref >60)
GFR calc non Af Amer: >60 mL/min (ref >60)
GLUCOSE: 96 mg/dL (ref 70–99)
POTASSIUM: 4.1 mmol/L (ref 3.5–5.1)
SODIUM: 137 mmol/L (ref 135–145)
TOTAL PROTEIN: 7.7 g/dL (ref 6.5–8.1)

## 2018-02-05 LAB — LIPASE, BLOOD: Lipase: 32 U/L (ref 11–51)

## 2018-02-05 MED ORDER — KETOROLAC TROMETHAMINE 30 MG/ML IJ SOLN
30.0000 mg | Freq: Once | INTRAMUSCULAR | Status: AC
  Administered 2018-02-05: 30 mg via INTRAMUSCULAR
  Filled 2018-02-05: qty 1

## 2018-02-05 NOTE — ED Provider Notes (Signed)
[facility]-EMERGENCY DEPT Provider Note   CSN: [id number] Arrival date & time: 02/04/18  2300     History   Chief Complaint Chief Complaint  Patient presents with  . Abdominal Pain  . Eye Pain  . Dizziness    HPI Melvin Medina is a 47 y.o. male.  HPI  This 47 year old male with no significant past medical history who presents with abdominal bloating, pressure, and dizziness.  Patient reports he has had some abdominal bloating mostly over the left side.  He reports normal bowel movements.  Last bowel movement was yesterday.  He denies any nausea or vomiting.  The bloating has become increasing only more uncomfortable.  He rates his discomfort at 8 out of 10.  He denies any urinary symptoms or hematuria.  He states that when he gets the bloating sensation, he also has pressure behind his bilateral eyes.  He denies any vision changes or photophobia.  No headache.  He also reports dizziness.  The symptoms have been ongoing and waxing and waning over the last 2 weeks.  Denies any recent fevers or upper respiratory symptoms.  Denies any significant weight loss or weight gain.  Does report increasing fatigue.  Past Medical History:  Diagnosis Date  . GI bleed     Patient Active Problem List   Diagnosis Date Noted  . Back pain of thoracolumbar region 10/14/2013  . Unspecified constipation 10/14/2013  . GERD (gastroesophageal reflux disease) 10/14/2013    Past Surgical History:  Procedure Laterality Date  . APPENDECTOMY          Home Medications    Prior to Admission medications   Medication Sig Start Date End Date Taking? Authorizing Provider  atovaquone-proguanil (MALARONE) 250-100 MG TABS tablet Take 1 tablet by mouth daily. Patient not taking: Reported on 02/05/2018 10/08/17   Doristine Bosworth, MD  meclizine (ANTIVERT) 25 MG tablet Take 1 tablet (25 mg total) by mouth 3 (three) times daily as needed for dizziness. Patient not taking: Reported  on 02/05/2018 10/08/17   Doristine Bosworth, MD    Family History History reviewed. No pertinent family history.  Social History Social History   Tobacco Use  . Smoking status: Never Smoker  . Smokeless tobacco: Never Used  Substance Use Topics  . Alcohol use: No  . Drug use: No     Allergies   Aspirin   Review of Systems Review of Systems  Constitutional: Negative for fever.  Eyes: Positive for pain. Negative for photophobia, discharge and visual disturbance.  Respiratory: Negative for shortness of breath.   Cardiovascular: Negative for chest pain.  Gastrointestinal: Positive for abdominal distention and abdominal pain. Negative for constipation, diarrhea, nausea and vomiting.  Genitourinary: Negative for dysuria and hematuria.  Neurological: Positive for dizziness. Negative for weakness, numbness and headaches.  All other systems reviewed and are negative.    Physical Exam Updated Vital Signs BP (!) 126/98 (BP Location: Right Arm)   Pulse 60   Temp 97.6 F (36.4 C) (Oral)   Resp 19   Ht 1.676 m (5\' 6" )   Wt 83.9 kg   SpO2 99%   BMI 29.86 kg/m   Physical Exam  Constitutional: He is oriented to person, place, and time. He appears well-developed and well-nourished.  HENT:  Head: Normocephalic and atraumatic.  Eyes: Pupils are equal, round, and reactive to light. EOM are normal.  Nonicteric, no evidence of conjunctivitis or drainage Pupils 4 mm reactive bilaterally  Neck: Neck supple.  Cardiovascular:  Normal rate, regular rhythm and normal heart sounds.  No murmur heard. Pulmonary/Chest: Effort normal and breath sounds normal. No respiratory distress. He has no wheezes.  Abdominal: Soft. Bowel sounds are normal. He exhibits no distension. There is no tenderness. There is no rebound and no guarding.  Musculoskeletal: He exhibits no edema.  Lymphadenopathy:    He has no cervical adenopathy.  Neurological: He is alert and oriented to person, place, and time.    Cranial nerves II through XII intact, 5 out of 5 strength in all 4 extremities, no dysmetria to finger-nose-finger  Skin: Skin is warm and dry.  Psychiatric: He has a normal mood and affect.  Nursing note and vitals reviewed.    ED Treatments / Results  Labs (all labs ordered are listed, but only abnormal results are displayed) Labs Reviewed  URINALYSIS, ROUTINE W REFLEX MICROSCOPIC - Abnormal; Notable for the following components:      Result Value   Color, Urine STRAW (*)    Specific Gravity, Urine 1.003 (*)    All other components within normal limits  TSH - Abnormal; Notable for the following components:   TSH 5.565 (*)    All other components within normal limits  LIPASE, BLOOD  COMPREHENSIVE METABOLIC PANEL  CBC    EKG EKG Interpretation  Date/Time:  Tuesday February 05 2018 04:04:37 EST Ventricular Rate:  56 PR Interval:    QRS Duration: 103 QT Interval:  418 QTC Calculation: 404 R Axis:   65 Text Interpretation:  Sinus rhythm Short PR interval ST elev, probable normal early repol pattern Confirmed by Ross Marcus ([id number]) on 02/05/2018 4:11:42 AM   Radiology Dg Abdomen Acute W/chest  Result Date: 02/05/2018 CLINICAL DATA:  Abdominal pain and bloating EXAM: DG ABDOMEN ACUTE W/ 1V CHEST COMPARISON:  09/23/2016 chest radiograph FINDINGS: There is no evidence of dilated bowel loops or free intraperitoneal air. No radiopaque calculi or other significant radiographic abnormality is seen. Heart size and mediastinal contours are within normal limits. Both lungs are clear. IMPRESSION: Negative abdominal radiographs.  No acute cardiopulmonary disease. Electronically Signed   By: Deatra Robinson M.D.   On: 02/05/2018 03:46    Procedures Procedures (including critical care time)  Medications Ordered in ED Medications  ketorolac (TORADOL) 30 MG/ML injection 30 mg (30 mg Intramuscular Given 02/05/18 0412)     Initial Impression / Assessment and Plan / ED Course  I  have reviewed the triage vital signs and the nursing notes.  Pertinent labs & imaging results that were available during my care of the patient were reviewed by me and considered in my medical decision making (see chart for details).     Patient presents with multiple vague complaints ongoing over the last several weeks.  He is overall nontoxic-appearing and vital signs are reassuring.  His exam is fairly benign.  Initial lab work reviewed and largely reassuring.  He is nontender on abdominal exam.  Acute abdominal series obtained.  No evidence of air-fluid levels or significant abnormality.  Given fatigue and vague symptoms, TSH was obtained.  TSH is slightly elevated.  This could represent new onset hypothyroidism.  Otherwise his work-up is reassuring.  I discussed this with the patient.  He needs to follow-up closely with his primary physician for full thyroid testing.  He does not appear to be in thyroid storm or myxedema coma.  After history, exam, and medical workup I feel the patient has been appropriately medically screened and is safe for discharge home. Pertinent diagnoses  were discussed with the patient. Patient was given return precautions.   Final Clinical Impressions(s) / ED Diagnoses   Final diagnoses:  Bloating  Fatigue, unspecified type  Elevated TSH    ED Discharge Orders    None       Shon BatonHorton, Isaul Landi F, MD 02/05/18 [phone]

## 2018-02-05 NOTE — Discharge Instructions (Addendum)
You were seen today for multiple complaints including abdominal bloating and fatigue.  Your work-up is only notable for mildly elevated TSH.  This could indicate some thyroid dysfunction.  This can cause some fatigue and abdominal symptoms.  Follow-up closely with your primary physician for full thyroid panel.

## 2018-02-20 NOTE — Progress Notes (Signed)
Patient ID: Melvin Medina, male   DOB: 02-25-1971, 47 y.o.   MRN: [id number]    Melvin Medina, is a 47 y.o. male  WUJ:[id number]  NFA:[id number]  DOB - [birth year]  Subjective:  Chief Complaint and HPI: Melvin Medina is a 47 y.o. male here today to establish care and for a follow up visit After ED visit 02/05/2018.  TSH was abnormal.  c/o Dry eyes, fatigue, dizziness X 2 months.  Also some nausea and abdominal bloating.  No changes in BM.  No vomiting.  No melena/hematochezia.  +Decrease appetite.  +gas-x didn't help.  Food exacerbates.     From ED note: Patient presents with multiple vague complaints ongoing over the last several weeks.  He is overall nontoxic-appearing and vital signs are reassuring.  His exam is fairly benign.  Initial lab work reviewed and largely reassuring.  He is nontender on abdominal exam.  Acute abdominal series obtained.  No evidence of air-fluid levels or significant abnormality.  Given fatigue and vague symptoms, TSH was obtained.  TSH is slightly elevated.  This could represent new onset hypothyroidism.  Otherwise his work-up is reassuring.  I discussed this with the patient.  He needs to follow-up closely with his primary physician for full thyroid testing.  He does not appear to be in thyroid storm or myxedema coma.  After history, exam, and medical workup I feel the patient has been appropriately medically screened and is safe for discharge home. Pertinent diagnoses were discussed with the patient. Patient was given return precautions.  ED/Hospital notes reviewed.   Family history:  No thyroid dz in family  ROS:   Constitutional:  No f/c, No night sweats, No unexplained weight loss. EENT:  No vision changes, No blurry vision, No hearing changes. No mouth, throat, or ear problems.  Respiratory: No cough, No SOB Cardiac: No CP, no palpitations GI:  No abd pain, No N/V/D. GU: No Urinary s/sx Musculoskeletal: No joint pain Neuro: No headache, no  dizziness, no motor weakness.  Skin: No rash Endocrine:  No polydipsia. No polyuria.  Psych: Denies SI/HI  No problems updated.  ALLERGIES: Allergies  Allergen Reactions  . Aspirin Other (See Comments)    ulcers    PAST MEDICAL HISTORY: Past Medical History:  Diagnosis Date  . GI bleed     MEDICATIONS AT HOME: Prior to Admission medications   Medication Sig Start Date End Date Taking? Authorizing Provider  atovaquone-proguanil (MALARONE) 250-100 MG TABS tablet Take 1 tablet by mouth daily. Patient not taking: Reported on 02/05/2018 10/08/17   Doristine Bosworth, MD  meclizine (ANTIVERT) 25 MG tablet Take 1 tablet (25 mg total) by mouth 3 (three) times daily as needed for dizziness. Patient not taking: Reported on 02/05/2018 10/08/17   Doristine Bosworth, MD  omeprazole (PRILOSEC) 20 MG capsule Take 1 capsule (20 mg total) by mouth daily. 02/21/18   Anders Simmonds, PA-C     Objective:  EXAM:   Vitals:   02/21/18 1416  BP: 113/69  Pulse: 80  Temp: 98 F (36.7 C)  TempSrc: Oral  SpO2: 99%  Weight: 207 lb 3.2 oz (94 kg)  Height: 5\' 7"  (1.702 m)    General appearance : A&OX3. NAD. Non-toxic-appearing HEENT: Atraumatic and Normocephalic.  PERRLA. EOM intact.  TM clear B. Mouth-MMM, post pharynx WNL w/o erythema, No PND. Neck: supple, no JVD. No cervical lymphadenopathy. No thyromegaly Chest/Lungs:  Breathing-non-labored, Good air entry bilaterally, breath sounds normal without rales, rhonchi, or wheezing  CVS: S1  S2 regular, no murmurs, gallops, rubs  Abdomen: Bowel sounds present, Non tender and not distended with no gaurding, rigidity or rebound. Extremities: Bilateral Lower Ext shows no edema, both legs are warm to touch with = pulse throughout Neurology:  CN II-XII grossly intact, Non focal.   Psych:  TP linear. J/I WNL. Normal speech. Appropriate eye contact and affect.  Skin:  No Rash  Data Review Lab Results  Component Value Date   HGBA1C 5.6 09/26/2016      Assessment & Plan   1. Bloating - H. pylori breath test - omeprazole (PRILOSEC) 20 MG capsule; Take 1 capsule (20 mg total) by mouth daily.  Dispense: 30 capsule; Refill: 3  2. Fatigue, unspecified type - Thyroid Panel With TSH - Vitamin D, 25-hydroxy  3. Abnormal TSH - Thyroid Panel With TSH  4. Encounter for examination following treatment at hospital Improving-  Patient have been counseled extensively about nutrition and exercise  Return in about 1 month (around 03/24/2018) for assign PCP.  The patient was given clear instructions to go to ER or return to medical center if symptoms don't improve, worsen or new problems develop. The patient verbalized understanding. The patient was told to call to get lab results if they haven't heard anything in the next week.     Georgian CoAngela McClung, PA-C [facility] [place], [place] [phone]   02/21/2018, 2:26 PM

## 2018-02-21 ENCOUNTER — Ambulatory Visit: Attending: Family Medicine | Admitting: Physician Assistant

## 2018-02-21 ENCOUNTER — Other Ambulatory Visit: Payer: Self-pay

## 2018-02-21 VITALS — BP 113/69 | HR 80 | Temp 98.0°F | Ht 67.0 in | Wt 207.2 lb

## 2018-02-21 DIAGNOSIS — R7989 Other specified abnormal findings of blood chemistry: Secondary | ICD-10-CM

## 2018-02-21 DIAGNOSIS — R5383 Other fatigue: Secondary | ICD-10-CM

## 2018-02-21 DIAGNOSIS — R140 Abdominal distension (gaseous): Secondary | ICD-10-CM

## 2018-02-21 DIAGNOSIS — Z09 Encounter for follow-up examination after completed treatment for conditions other than malignant neoplasm: Secondary | ICD-10-CM

## 2018-02-21 MED ORDER — OMEPRAZOLE 20 MG PO CPDR: 20.0000 mg | DELAYED_RELEASE_CAPSULE | Freq: Every day | ORAL | 3 refills | Status: DC

## 2018-02-22 ENCOUNTER — Telehealth (INDEPENDENT_AMBULATORY_CARE_PROVIDER_SITE_OTHER): Payer: Self-pay

## 2018-02-22 LAB — THYROID PANEL WITH TSH
Free Thyroxine Index: 1.6 (ref 1.2–4.9)
T3 Uptake Ratio: 27 % (ref 24–39)
T4, Total: 6 ug/dL (ref 4.5–12.0)
TSH: 2.32 u[IU]/mL (ref 0.450–4.500)

## 2018-02-22 LAB — H. PYLORI BREATH TEST: H PYLORI BREATH TEST: NEGATIVE

## 2018-02-22 LAB — VITAMIN D 25 HYDROXY (VIT D DEFICIENCY, FRACTURES): VIT D 25 HYDROXY: 28 ng/mL — AB (ref 30.0–100.0)

## 2018-02-22 NOTE — Telephone Encounter (Signed)
Patient voicemail not setup. Melvin Medina, CMA  

## 2018-02-22 NOTE — Telephone Encounter (Signed)
-----   Message from Anders SimmondsAngela M McClung, [place]PA-C sent at 02/22/2018  7:49 AM EST ----- All three of his thyroid tests are normal now. His vitamin D is slightly low.  This can contribute to muscle aches, anxiety, fatigue, and depression.  Take 2000 units vitamin D(he can buy OTC).  Follow-up to be assigned PCP as planned and We will recheck this level in 3-4 months.  Thanks, Georgian CoAngela McClung, PA-C

## 2018-03-20 ENCOUNTER — Other Ambulatory Visit: Payer: Self-pay

## 2018-03-20 ENCOUNTER — Ambulatory Visit: Attending: Nurse Practitioner | Admitting: Nurse Practitioner

## 2018-03-20 ENCOUNTER — Encounter: Payer: Self-pay | Admitting: Nurse Practitioner

## 2018-03-20 VITALS — BP 106/77 | HR 83 | Temp 98.2°F | Resp 16 | Wt 209.0 lb

## 2018-03-20 DIAGNOSIS — Z23 Encounter for immunization: Secondary | ICD-10-CM | POA: Diagnosis not present

## 2018-03-20 DIAGNOSIS — R42 Dizziness and giddiness: Secondary | ICD-10-CM | POA: Diagnosis not present

## 2018-03-20 MED ORDER — MECLIZINE HCL 25 MG PO TABS: 25.0000 mg | ORAL_TABLET | Freq: Three times a day (TID) | ORAL | 1 refills | Status: DC | PRN

## 2018-03-20 NOTE — Patient Instructions (Signed)
Probiotics Probiotics are the good bacteria and yeasts that live in your body and keep your digestive system healthy. Probiotics also help your body's defense system (immune system) and protect your body against the growth of harmful bacteria. Your health care provider may recommend taking a probiotic if you are taking antibiotics or have certain medical conditions, such as:  Diarrhea.  Constipation.  Irritable bowel syndrome.  Lung infections.  Yeast infections.  Acne, eczema, and other skin conditions.  Frequent urinary tract infections. What affects the balance of bacteria in my body? The balance of good bacteria in your body can be affected by:  Antibiotic medicines. These medicines treat infections caused by bacteria. Unfortunately, they may kill the good bacteria in your body as well as the bad bacteria.  Certain medical conditions. Conditions related to an imbalance of bacteria include: ? Stomach and intestine (gastrointestinal) infections. ? Lung infections. ? Skin infections. ? Vaginal infections. ? Inflammatory bowel diseases. ? Stomach ulcers (gastric ulcers). ? Tooth decay and gum disease (periodontal disease).  Stress.  Poor diet. What type of probiotic is right for me? Probiotics contain different types of bacteria (strains). Strains commonly found in probiotics include:  Lactobacillus.  Saccharomyces.  Bifidobacterium. Specific strains have been shown to be more effective for certain health conditions. Ask your health care provider which strain or strains you should use and how often. Probiotics come in many different forms, strain combinations, and strengths. Some may need to be refrigerated. Always read the label for storage and usage instructions. Certain foods, such as yogurt, contain probiotics. Probiotics can also be bought as a supplement at a pharmacy, health food store, or grocery store. Talk to your health care provider before starting any  supplement. What are the side effects of probiotics? Some people have side effects when taking probiotics. Side effects are usually temporary and may include:  Gas.  Bloating.  Cramping. Serious side effects are rare. Follow these instructions at home:   If you are taking probiotics with antibiotics: ? Wait at least 2 hours between taking your medicine and the probiotic. ? Eat foods high in fiber, such as whole grains, beans, and vegetables. These foods can help good bacteria grow. ? Avoid certain foods as told by your health care provider. Summary  Probiotics are the good bacteria and yeasts that live in your body and keep you and your digestive system healthy.  Certain foods, such as yogurt, contain probiotics.  Probiotics can be taken as supplements. They can be bought at a pharmacy, health food store, or grocery store. They come in many different forms, strain combinations, and strengths.  Be sure to talk with your health care provider before taking a probiotic supplement. This information is not intended to replace advice given to you by your health care provider. Make sure you discuss any questions you have with your health care provider. Document Released: 09/24/2013 Document Revised: 11/16/2017 Document Reviewed: 03/14/2017 Elsevier Interactive Patient Education  2019 Elsevier Inc. Cyanocobalamin, Pyridoxine, and Folate What is this medicine? A multivitamin containing folic acid, vitamin B6, and vitamin B12. This medicine may be used for other purposes; ask your health care provider or pharmacist if you have questions. COMMON BRAND NAME(S): AllanFol RX, AllanTex, Av-Vite FB, B Complex with Folic Acid, ComBgen, FaBB, Folamin, Folastin, [place]Folbalin, [place]Folbee, [place]Folbic, [place]Folcaps, [place]Folgard, Lehman BrothersFolgard RX, [place]Folgard RX 2.2, [place]Folplex, [place]Folplex 2.2, Foltabs 800, Foltx, Homocysteine Formula, Niva-Fol, NuFol, TL First Data Corporationard RX, Virt-Gard, Virt-Vite, Virt-Vite North[place]Forte, Vita-Respa What should I tell my health  care provider  before I take this medicine? They need to know if you have any of these conditions: -bleeding or clotting disorder -history of anemia of any type -other chronic health condition -an unusual or allergic reaction to vitamins, other medicines, foods, dyes, or preservatives -pregnant or trying to get pregnant -breast-feeding How should I use this medicine? Take by mouth with a glass of water. May take with food. Follow the directions on the prescription label. It is usually given once a day. Do not take your medicine more often than directed. Contact your pediatrician regarding the use of this medicine in children. Special care may be needed. Overdosage: If you think you have taken too much of this medicine contact a poison control center or emergency room at once. NOTE: This medicine is only for you. Do not share this medicine with others. What if I miss a dose? If you miss a dose, take it as soon as you can. If it is almost time for your next dose, take only that dose. Do not take double or extra doses. What may interact with this medicine? -levodopa This list may not describe all possible interactions. Give your health care provider a list of all the medicines, herbs, non-prescription drugs, or dietary supplements you use. Also tell them if you smoke, drink alcohol, or use illegal drugs. Some items may interact with your medicine. What should I watch for while using this medicine? See your health care professional for regular checks on your progress. Remember that vitamin supplements do not replace the need for good nutrition from a balanced diet. What side effects may I notice from receiving this medicine? Side effects that you should report to your doctor or health care professional as soon as possible: -allergic reaction such as skin rash or difficulty breathing -vomiting Side effects that usually do not require medical attention (report to your doctor or health care  professional if they continue or are bothersome): -nausea -stomach upset This list may not describe all possible side effects. Call your doctor for medical advice about side effects. You may report side effects to FDA at 1-800-FDA-1088. Where should I keep my medicine? Keep out of the reach of children. Most vitamins should be stored at controlled room temperature. Check your specific product directions. Protect from heat and moisture. Throw away any unused medicine after the expiration date. NOTE: This sheet is a summary. It may not cover all possible information. If you have questions about this medicine, talk to your doctor, pharmacist, or health care provider.  2019 Elsevier/Gold Standard (2007-04-20 00:59:55)

## 2018-03-20 NOTE — Progress Notes (Signed)
Assessment & Plan:  [name] was seen today for follow-up.  Diagnoses and all orders for this visit:  Dizziness -     meclizine (ANTIVERT) 25 MG tablet; Take 1 tablet (25 mg total) by mouth 3 (three) times daily as needed for dizziness.  Need for immunization against influenza -     Flu Vaccine QUAD 36+ mos IM    Patient has been counseled on age-appropriate routine health concerns for -screening and prevention. These are reviewed and up-to-date. Referrals have been placed accordingly. Immunizations are up-to-date or declined.    Subjective:   Chief Complaint  Patient presents with  . Follow-up   HPI Melvin Medina 48 y.o. male presents to office today to establish care. He has a history of dizziness for which he takes meclizine. He was seen in the office for bloating and nausea on 02-21-2018. Work up including Hpylori was negative. He states the bloating occurs after he eats or drinks and resolves after he has a bowel movement. Denies abdominal pain, nausea, vomiting, diarrhea or constipation. Bloating may be dietary related.    Dizziness Chronic. Intermittent. Inciting factors: None.  Other symptoms include fatigue. Denies nausea, vomiting. CT and EKG negative 10-08-2017. Symptoms are currently stable.     Review of Systems  Constitutional: Negative for fever, malaise/fatigue and weight loss.  HENT: Negative.  Negative for nosebleeds.   Eyes: Negative.  Negative for blurred vision, double vision and photophobia.  Respiratory: Negative.  Negative for cough and shortness of breath.   Cardiovascular: Negative.  Negative for chest pain, palpitations and leg swelling.  Gastrointestinal: Negative for abdominal pain, blood in stool, constipation, diarrhea, heartburn, melena, nausea and vomiting.       Intermittent bloating  Musculoskeletal: Negative.  Negative for myalgias.  Neurological: Positive for dizziness. Negative for focal weakness, seizures and headaches.    Psychiatric/Behavioral: Negative.  Negative for suicidal ideas.    Past Medical History:  Diagnosis Date  . GI bleed     Past Surgical History:  Procedure Laterality Date  . APPENDECTOMY      No family history on file.  Social History Reviewed with no changes to be made today.   Outpatient Medications Prior to Visit  Medication Sig Dispense Refill  . meclizine (ANTIVERT) 25 MG tablet Take 1 tablet (25 mg total) by mouth 3 (three) times daily as needed for dizziness. 30 tablet 0  . atovaquone-proguanil (MALARONE) 250-100 MG TABS tablet Take 1 tablet by mouth daily. (Patient not taking: Reported on 03/20/2018) 120 tablet 0  . omeprazole (PRILOSEC) 20 MG capsule Take 1 capsule (20 mg total) by mouth daily. (Patient not taking: Reported on 03/20/2018) 30 capsule 3   No facility-administered medications prior to visit.     Allergies  Allergen Reactions  . Aspirin Other (See Comments)    ulcers       Objective:    BP 106/77 (BP Location: Right Arm, Patient Position: Sitting, Cuff Size: Large)   Pulse 83   Temp 98.2 F (36.8 C) (Oral)   Resp 16   Wt 209 lb (94.8 kg)   SpO2 96%   BMI 32.73 kg/m  Wt Readings from Last 3 Encounters:  03/20/18 209 lb (94.8 kg)  02/21/18 207 lb 3.2 oz (94 kg)  02/04/18 185 lb (83.9 kg)    Physical Exam Vitals signs and nursing note reviewed.  Constitutional:      Appearance: He is well-developed.  HENT:     Head: Normocephalic and atraumatic.  Neck:  Musculoskeletal: Normal range of motion.  Cardiovascular:     Rate and Rhythm: Normal rate and regular rhythm.     Heart sounds: Normal heart sounds. No murmur. No friction rub. No gallop.   Pulmonary:     Effort: Pulmonary effort is normal. No tachypnea or respiratory distress.     Breath sounds: Normal breath sounds. No decreased breath sounds, wheezing, rhonchi or rales.  Chest:     Chest wall: No tenderness.  Abdominal:     General: Abdomen is flat. Bowel sounds are normal.      Palpations: Abdomen is soft.     Tenderness: There is no abdominal tenderness. There is no right CVA tenderness or left CVA tenderness.     Hernia: No hernia is present.  Musculoskeletal: Normal range of motion.  Skin:    General: Skin is warm and dry.  Neurological:     Mental Status: He is alert and oriented to person, place, and time.     Coordination: Coordination normal.  Psychiatric:        Behavior: Behavior normal. Behavior is cooperative.        Thought Content: Thought content normal.        Judgment: Judgment normal.          Patient has been counseled extensively about nutrition and exercise as well as the importance of adherence with medications and regular follow-up. The patient was given clear instructions to go to ER or return to medical center if symptoms don't improve, worsen or new problems develop. The patient verbalized understanding.   Follow-up: No follow-ups on file.   Claiborne RiggZelda W Shadrack Brummitt, FNP-BC [facility] [place], [place] [phone]   03/21/2018, 5:43 PM

## 2018-03-20 NOTE — Progress Notes (Signed)
Establish care Informed of negative H.pylori results from last OV 02/21/2018.    no longer takes omeprazole  c/o bloating

## 2018-03-21 ENCOUNTER — Encounter: Payer: Self-pay | Admitting: Nurse Practitioner

## 2018-09-11 ENCOUNTER — Emergency Department (HOSPITAL_BASED_OUTPATIENT_CLINIC_OR_DEPARTMENT_OTHER)
Admission: EM | Admit: 2018-09-11 | Discharge: 2018-09-11 | Disposition: A | Discharge status: Home / Self Care | Payer: BLUE CROSS/BLUE SHIELD | Attending: Emergency Medicine | Admitting: Emergency Medicine

## 2018-09-11 ENCOUNTER — Other Ambulatory Visit: Payer: Self-pay

## 2018-09-11 ENCOUNTER — Emergency Department (HOSPITAL_BASED_OUTPATIENT_CLINIC_OR_DEPARTMENT_OTHER): Payer: BLUE CROSS/BLUE SHIELD

## 2018-09-11 ENCOUNTER — Encounter (HOSPITAL_BASED_OUTPATIENT_CLINIC_OR_DEPARTMENT_OTHER): Payer: Self-pay | Admitting: *Deleted

## 2018-09-11 DIAGNOSIS — Z79899 Other long term (current) drug therapy: Secondary | ICD-10-CM | POA: Diagnosis not present

## 2018-09-11 DIAGNOSIS — R5383 Other fatigue: Secondary | ICD-10-CM | POA: Diagnosis not present

## 2018-09-11 DIAGNOSIS — R140 Abdominal distension (gaseous): Secondary | ICD-10-CM

## 2018-09-11 DIAGNOSIS — R1084 Generalized abdominal pain: Secondary | ICD-10-CM | POA: Insufficient documentation

## 2018-09-11 HISTORY — DX: Gastro-esophageal reflux disease without esophagitis: K21.9

## 2018-09-11 LAB — CBC WITH DIFFERENTIAL/PLATELET
Abs Immature Granulocytes: 0.02 10*3/uL (ref 0.00–0.07)
Basophils Absolute: 0 10*3/uL (ref 0.0–0.1)
Basophils Relative: 1 %
Eosinophils Absolute: 0.2 10*3/uL (ref 0.0–0.5)
Eosinophils Relative: 3 %
HCT: 44.7 % (ref 39.0–52.0)
Hemoglobin: 15.1 g/dL (ref 13.0–17.0)
Immature Granulocytes: 0 %
Lymphocytes Relative: 38 %
Lymphs Abs: 2.4 10*3/uL (ref 0.7–4.0)
MCH: 29.6 pg (ref 26.0–34.0)
MCHC: 33.8 g/dL (ref 30.0–36.0)
MCV: 87.6 fL (ref 80.0–100.0)
Monocytes Absolute: 0.9 10*3/uL (ref 0.1–1.0)
Monocytes Relative: 14 %
Neutro Abs: 2.8 10*3/uL (ref 1.7–7.7)
Neutrophils Relative %: 44 %
Platelets: 236 10*3/uL (ref 150–400)
RBC: 5.1 MIL/uL (ref 4.22–5.81)
RDW: 12.3 % (ref 11.5–15.5)
WBC: 6.3 10*3/uL (ref 4.0–10.5)
nRBC: 0 % (ref 0.0–0.2)

## 2018-09-11 LAB — BASIC METABOLIC PANEL
Anion gap: 8 (ref 5–15)
BUN: 15 mg/dL (ref 6–20)
CO2: 27 mmol/L (ref 22–32)
Calcium: 9.1 mg/dL (ref 8.9–10.3)
Chloride: 102 mmol/L (ref 98–111)
Creatinine, Ser: 0.97 mg/dL (ref 0.61–1.24)
GFR calc Af Amer: >60 mL/min (ref >60)
GFR calc non Af Amer: >60 mL/min (ref >60)
Glucose, Bld: 94 mg/dL (ref 70–99)
Potassium: 4 mmol/L (ref 3.5–5.1)
Sodium: 137 mmol/L (ref 135–145)

## 2018-09-11 MED ORDER — IOHEXOL 300 MG/ML  SOLN: 100.0000 mL | Freq: Once | INTRAMUSCULAR | Status: DC | PRN

## 2018-09-11 MED ORDER — LACTULOSE 10 GM/15ML PO SOLN: 10.0000 g | Freq: Every day | ORAL | 0 refills | Status: DC | PRN

## 2018-09-11 NOTE — ED Provider Notes (Signed)
[facility] EMERGENCY DEPARTMENT Provider Note   CSN: [id number] Arrival date & time: 09/11/18  2122     History   Chief Complaint Chief Complaint  Patient presents with   Bloated    HPI Melvin Medina is a 48 y.o. male.  Been struggling with abdominal pain abdominal bloating and feeling sluggish for 3 months.  He has been seen here and had some blood work and an Museum/gallery curatorx-ray.  He is followed up with GI and is on acid medication without any improvement in his symptoms.  He said his pain and bloating were worse today and he called the office they told him to come to the emergency department.  No fevers or chills no cough no chest pain no diarrhea.  No urinary symptoms.  Prior history of an appendectomy.     The history is provided by the patient.  Abdominal Pain Pain location:  Generalized Pain quality: bloating   Pain radiates to:  Does not radiate Pain severity:  Moderate Onset quality:  Gradual Timing:  Intermittent Progression:  Waxing and waning Chronicity:  Chronic Context: previous surgery   Context: not recent illness, not recent travel, not sick contacts, not suspicious food intake and not trauma   Relieved by:  Nothing Worsened by:  Nothing Ineffective treatments:  OTC medications Associated symptoms: fatigue   Associated symptoms: no chest pain, no constipation, no cough, no diarrhea, no dysuria, no fever, no hematemesis, no hematochezia, no hematuria, no nausea, no shortness of breath, no sore throat and no vomiting     Past Medical History:  Diagnosis Date   GERD (gastroesophageal reflux disease)    GI bleed     Patient Active Problem List   Diagnosis Date Noted   Back pain of thoracolumbar region 10/14/2013   Unspecified constipation 10/14/2013   GERD (gastroesophageal reflux disease) 10/14/2013    Past Surgical History:  Procedure Laterality Date   APPENDECTOMY          Home Medications    Prior to Admission medications     Medication Sig Start Date End Date Taking? Authorizing Provider  atovaquone-proguanil (MALARONE) 250-100 MG TABS tablet Take 1 tablet by mouth daily. Patient not taking: Reported on 03/20/2018 10/08/17   Doristine BosworthStallings, Zoe A, MD  meclizine (ANTIVERT) 25 MG tablet Take 1 tablet (25 mg total) by mouth 3 (three) times daily as needed for dizziness. 03/20/18   Claiborne RiggFleming, Zelda W, NP  omeprazole (PRILOSEC) 20 MG capsule Take 1 capsule (20 mg total) by mouth daily. Patient not taking: Reported on 03/20/2018 02/21/18   Bary RichardMcClung, Angela M, PA-C    Family History History reviewed. No pertinent family history.  Social History Social History   Tobacco Use   Smoking status: Never Smoker   Smokeless tobacco: Never Used  Substance Use Topics   Alcohol use: No   Drug use: No     Allergies   Aspirin   Review of Systems Review of Systems  Constitutional: Positive for fatigue. Negative for fever.  HENT: Negative for sore throat.   Eyes: Negative for visual disturbance.  Respiratory: Negative for cough and shortness of breath.   Cardiovascular: Negative for chest pain.  Gastrointestinal: Positive for abdominal pain. Negative for constipation, diarrhea, hematemesis, hematochezia, nausea and vomiting.  Genitourinary: Negative for dysuria and hematuria.  Musculoskeletal: Negative for back pain.  Skin: Negative for rash.  Neurological: Negative for headaches.     Physical Exam Updated Vital Signs Pulse (!) 101    Resp 18  Ht 5\' 6"  (1.676 m)    Wt 83.5 kg    SpO2 97%    BMI 29.70 kg/m   Physical Exam Vitals signs and nursing note reviewed.  Constitutional:      Appearance: He is well-developed.  HENT:     Head: Normocephalic and atraumatic.  Eyes:     Conjunctiva/sclera: Conjunctivae normal.  Neck:     Musculoskeletal: Neck supple.  Cardiovascular:     Rate and Rhythm: Normal rate and regular rhythm.     Heart sounds: No murmur.  Pulmonary:     Effort: Pulmonary effort is normal. No  respiratory distress.     Breath sounds: Normal breath sounds.  Abdominal:     Palpations: Abdomen is soft.     Tenderness: There is no abdominal tenderness. There is no guarding or rebound.  Musculoskeletal: Normal range of motion.     Right lower leg: No edema.     Left lower leg: No edema.  Skin:    General: Skin is warm and dry.     Capillary Refill: Capillary refill takes less than 2 seconds.  Neurological:     General: No focal deficit present.     Mental Status: He is alert.     Gait: Gait normal.      ED Treatments / Results  Labs (all labs ordered are listed, but only abnormal results are displayed) Labs Reviewed  BASIC METABOLIC PANEL  CBC WITH DIFFERENTIAL/PLATELET  TSH    EKG None  Radiology Ct Abdomen Pelvis Wo Contrast  Result Date: 09/11/2018 CLINICAL DATA:  48 year old male with abdominal pain and bloating. EXAM: CT ABDOMEN AND PELVIS WITHOUT CONTRAST TECHNIQUE: Multidetector CT imaging of the abdomen and pelvis was performed following the standard protocol without IV contrast. COMPARISON:  Acute abdominal series 02/05/2018 FINDINGS: Lower chest: Mild elevation of the right hemidiaphragm, otherwise negative. No pericardial or pleural effusion. Hepatobiliary: Negative noncontrast liver and gallbladder. Pancreas: Negative. Spleen: Negative. Adrenals/Urinary Tract: Normal adrenal glands. Negative noncontrast kidneys. Negative course of both ureters. Unremarkable urinary bladder. No inflammatory stranding. Stomach/Bowel: Low-density retained stool in the rectum and intermittently throughout the large bowel. No large bowel wall thickening or mesenteric stranding. Negative terminal ileum. Appendix is diminutive or absent. No dilated small bowel. Negative stomach. No free air, free fluid. Vascular/Lymphatic: Vascular patency is not evaluated in the absence of IV contrast. No lymphadenopathy. Reproductive: Negative. Other: No pelvic free fluid. Musculoskeletal: No acute  osseous abnormality identified. Minor lumbar disc degeneration. IMPRESSION: Negative noncontrast CT Abdomen and Pelvis. Electronically Signed   By: Odessa FlemingH  Hall M.D.   On: 09/11/2018 22:30    Procedures Procedures (including critical care time)  Medications Ordered in ED Medications - No data to display   Initial Impression / Assessment and Plan / ED Course  I have reviewed the triage vital signs and the nursing notes.  Pertinent labs & imaging results that were available during my care of the patient were reviewed by me and considered in my medical decision making (see chart for details).  Clinical Course as of Sep 12 834  Wed Sep 11, 2018  [id number] year old male with no significant past medical history here with abdominal bloating and fatigue that is been going on for months worsening today.  He has been put on omeprazole by GI.  Differential diagnosis includes peptic ulcer disease, constipation, diverticulitis, irritable bowel, depression.   [MB]  2211 Was informed by the CT tech that the patient is on the CT table now and  refusing to do contrast IV.  He is unable to be persuaded that it would add benefit to trying to figure out what his symptoms are about.   [MB]  2240 I reviewed the results of the patient's CAT scan and his blood work with him.  He is agreeable to start on some laxative medication and follow-up with his GI doctor.   [MB]    Clinical Course User Index [MB] Hayden Rasmussen, MD        Final Clinical Impressions(s) / ED Diagnoses   Final diagnoses:  Generalized abdominal pain  Abdominal bloating  Fatigue, unspecified type    ED Discharge Orders         Ordered    lactulose (CHRONULAC) 10 GM/15ML solution  Daily PRN     09/11/18 2243           Hayden Rasmussen, MD 09/12/18 [phone]

## 2018-09-11 NOTE — ED Triage Notes (Signed)
Pt c/o bloating x " months"

## 2018-09-11 NOTE — Discharge Instructions (Addendum)
You were seen in the emergency department for continued abdominal pain bloating and fatigue.  You had blood work and a CAT scan that did not show any serious findings.  It did look like you had a lot of extra stool in your intestine and we are putting you on a laxative for that.  Your thyroid test was still pending at time of discharge and this will need to be followed up with you in your doctor.

## 2018-09-12 LAB — TSH: TSH: 3.852 u[IU]/mL (ref 0.350–4.500)

## 2018-10-18 ENCOUNTER — Ambulatory Visit: Payer: BLUE CROSS/BLUE SHIELD | Admitting: Nurse Practitioner

## 2018-10-23 ENCOUNTER — Ambulatory Visit: Payer: BLUE CROSS/BLUE SHIELD | Admitting: Nurse Practitioner

## 2019-05-12 ENCOUNTER — Encounter: Payer: Self-pay | Admitting: Nurse Practitioner

## 2019-05-12 ENCOUNTER — Other Ambulatory Visit: Payer: Self-pay

## 2019-05-12 ENCOUNTER — Ambulatory Visit: Attending: Nurse Practitioner | Admitting: Nurse Practitioner

## 2019-05-12 DIAGNOSIS — Z1211 Encounter for screening for malignant neoplasm of colon: Secondary | ICD-10-CM

## 2019-05-12 NOTE — Progress Notes (Signed)
I attempted to contact Melvin Medina 3 separate times. I was unable to leave a voicemail  11:33   11:36  11:39

## 2019-05-14 ENCOUNTER — Ambulatory Visit: Attending: Family Medicine

## 2019-05-14 ENCOUNTER — Other Ambulatory Visit: Payer: Self-pay

## 2019-05-14 ENCOUNTER — Other Ambulatory Visit: Payer: Self-pay | Admitting: Nurse Practitioner

## 2019-05-14 DIAGNOSIS — Z130 Encounter for screening for diseases of the blood and blood-forming organs and certain disorders involving the immune mechanism: Secondary | ICD-10-CM

## 2019-05-14 DIAGNOSIS — Z13228 Encounter for screening for other metabolic disorders: Secondary | ICD-10-CM

## 2019-05-14 DIAGNOSIS — Z13220 Encounter for screening for lipoid disorders: Secondary | ICD-10-CM

## 2019-05-14 DIAGNOSIS — Z131 Encounter for screening for diabetes mellitus: Secondary | ICD-10-CM

## 2019-05-14 DIAGNOSIS — Z125 Encounter for screening for malignant neoplasm of prostate: Secondary | ICD-10-CM

## 2019-05-15 LAB — LIPID PANEL
Chol/HDL Ratio: 2.2 ratio (ref 0.0–5.0)
Cholesterol, Total: 114 mg/dL (ref 100–199)
HDL: 51 mg/dL (ref >39)
LDL Chol Calc (NIH): 51 mg/dL (ref 0–99)
Triglycerides: 50 mg/dL (ref 0–149)
VLDL Cholesterol Cal: 12 mg/dL (ref 5–40)

## 2019-05-15 LAB — CMP14+EGFR
ALT: 18 IU/L (ref 0–44)
AST: 21 IU/L (ref 0–40)
Albumin/Globulin Ratio: 1.8 (ref 1.2–2.2)
Albumin: 4.4 g/dL (ref 4.0–5.0)
Alkaline Phosphatase: 64 IU/L (ref 39–117)
BUN/Creatinine Ratio: 10 (ref 9–20)
BUN: 12 mg/dL (ref 6–24)
Bilirubin Total: 1.5 mg/dL — ABNORMAL HIGH (ref 0.0–1.2)
CO2: 24 mmol/L (ref 20–29)
Calcium: 9.2 mg/dL (ref 8.7–10.2)
Chloride: 101 mmol/L (ref 96–106)
Creatinine, Ser: 1.2 mg/dL (ref 0.76–1.27)
GFR calc Af Amer: 81 mL/min/{1.73_m2} (ref >59)
GFR calc non Af Amer: 70 mL/min/{1.73_m2} (ref >59)
Globulin, Total: 2.5 g/dL (ref 1.5–4.5)
Glucose: 102 mg/dL — ABNORMAL HIGH (ref 65–99)
Potassium: 4.3 mmol/L (ref 3.5–5.2)
Sodium: 139 mmol/L (ref 134–144)
Total Protein: 6.9 g/dL (ref 6.0–8.5)

## 2019-05-15 LAB — CBC
Hematocrit: 45.2 % (ref 37.5–51.0)
Hemoglobin: 15.5 g/dL (ref 13.0–17.7)
MCH: 30.7 pg (ref 26.6–33.0)
MCHC: 34.3 g/dL (ref 31.5–35.7)
MCV: 90 fL (ref 79–97)
Platelets: 258 10*3/uL (ref 150–450)
RBC: 5.05 x10E6/uL (ref 4.14–5.80)
RDW: 12.9 % (ref 11.6–15.4)
WBC: 3.7 10*3/uL (ref 3.4–10.8)

## 2019-05-15 LAB — HEMOGLOBIN A1C
Est. average glucose Bld gHb Est-mCnc: 111 mg/dL
Hgb A1c MFr Bld: 5.5 % (ref 4.8–5.6)

## 2019-05-15 LAB — PSA: Prostate Specific Ag, Serum: 0.8 ng/mL (ref 0.0–4.0)

## 2019-06-16 ENCOUNTER — Encounter: Payer: Self-pay | Admitting: Nurse Practitioner

## 2019-06-16 ENCOUNTER — Other Ambulatory Visit: Payer: Self-pay

## 2019-06-16 ENCOUNTER — Ambulatory Visit: Attending: Nurse Practitioner | Admitting: Nurse Practitioner

## 2019-06-16 VITALS — BP 108/66 | HR 84 | Temp 97.7°F | Wt 195.0 lb

## 2019-06-16 DIAGNOSIS — R17 Unspecified jaundice: Secondary | ICD-10-CM

## 2019-06-16 NOTE — Progress Notes (Signed)
Assessment & Plan:  Melvin Medina was seen today for follow-up.  Diagnoses and all orders for this visit:  Elevated bilirubin -     Bilirubin, total    Patient has been counseled on age-appropriate routine health concerns for screening and prevention. These are reviewed and up-to-date. Referrals have been placed accordingly. Immunizations are up-to-date or declined.    Subjective:   Chief Complaint  Patient presents with  . Follow-up    Pt. is here follow up. Pt. stated he's been getting shortness of breath.    HPI Melvin Medina 49 y.o. male presents to office today for follow up.  Feels short of breath when he is not wearing his mask but does not feel short of breath when his mask his on. Feels this all started with COVID pandemic and having to wear a mask since last year. Denies cough, shortness of breath, chest pain, wheezing.    Elevated t bili. Denies previous gallbladder stones or surgery. Has started working out and eating healthier. Denies any abdominal pain, nausea, vomiting.   Lab Results  Component Value Date   BILITOT 1.5 (H) 05/14/2019    BP Readings from Last 3 Encounters:  06/16/19 108/66  09/11/18 134/83  03/20/18 106/77       Review of Systems  Constitutional: Negative for fever, malaise/fatigue and weight loss.  HENT: Negative.  Negative for nosebleeds.   Eyes: Negative.  Negative for blurred vision, double vision and photophobia.  Respiratory: Positive for shortness of breath. Negative for cough and wheezing.   Cardiovascular: Negative.  Negative for chest pain, palpitations and leg swelling.  Gastrointestinal: Negative.  Negative for heartburn, nausea and vomiting.  Musculoskeletal: Negative.  Negative for myalgias.  Neurological: Negative.  Negative for dizziness, focal weakness, seizures and headaches.  Psychiatric/Behavioral: Negative.  Negative for suicidal ideas.    Past Medical History:  Diagnosis Date  . GERD (gastroesophageal reflux  disease)   . GI bleed     Past Surgical History:  Procedure Laterality Date  . APPENDECTOMY      Family History  Problem Relation Age of Onset  . Hypertension Mother     Social History Reviewed with no changes to be made today.   Outpatient Medications Prior to Visit  Medication Sig Dispense Refill  . atovaquone-proguanil (MALARONE) 250-100 MG TABS tablet Take 1 tablet by mouth daily. (Patient not taking: Reported on 03/20/2018) 120 tablet 0  . lactulose (CHRONULAC) 10 GM/15ML solution Take 15 mLs (10 g total) by mouth daily as needed for mild constipation. (Patient not taking: Reported on 05/12/2019) 236 mL 0  . meclizine (ANTIVERT) 25 MG tablet Take 1 tablet (25 mg total) by mouth 3 (three) times daily as needed for dizziness. (Patient not taking: Reported on 05/12/2019) 30 tablet 1  . omeprazole (PRILOSEC) 20 MG capsule Take 1 capsule (20 mg total) by mouth daily. (Patient not taking: Reported on 03/20/2018) 30 capsule 3   No facility-administered medications prior to visit.    Allergies  Allergen Reactions  . Aspirin Other (See Comments)    ulcers       Objective:    BP 108/66 (BP Location: Left Arm, Patient Position: Sitting, Cuff Size: Normal)   Pulse 84   Temp 97.7 F (36.5 C) (Temporal)   Wt 195 lb (88.5 kg)   SpO2 98%   BMI 31.47 kg/m  Wt Readings from Last 3 Encounters:  06/16/19 195 lb (88.5 kg)  09/11/18 184 lb (83.5 kg)  03/20/18 209 lb (94.8 kg)  Physical Exam Vitals and nursing note reviewed.  Constitutional:      Appearance: He is well-developed.  HENT:     Head: Normocephalic and atraumatic.  Cardiovascular:     Rate and Rhythm: Normal rate and regular rhythm.     Heart sounds: Normal heart sounds. No murmur. No friction rub. No gallop.   Pulmonary:     Effort: Pulmonary effort is normal. No tachypnea or respiratory distress.     Breath sounds: Normal breath sounds. No decreased breath sounds, wheezing, rhonchi or rales.  Chest:     Chest wall:  No tenderness.  Abdominal:     General: Bowel sounds are normal.     Palpations: Abdomen is soft.  Musculoskeletal:        General: Normal range of motion.     Cervical back: Normal range of motion.  Skin:    General: Skin is warm and dry.  Neurological:     Mental Status: He is alert and oriented to person, place, and time.     Coordination: Coordination normal.  Psychiatric:        Behavior: Behavior normal. Behavior is cooperative.        Thought Content: Thought content normal.        Judgment: Judgment normal.          Patient has been counseled extensively about nutrition and exercise as well as the importance of adherence with medications and regular follow-up. The patient was given clear instructions to go to ER or return to medical center if symptoms don't improve, worsen or new problems develop. The patient verbalized understanding.   Follow-up: Return if symptoms worsen or fail to improve.   Gildardo Pounds, FNP-BC [facility] [place], [place]   06/16/2019, 3:55 PM

## 2019-06-17 LAB — BILIRUBIN, TOTAL: Bilirubin Total: 1.3 mg/dL — ABNORMAL HIGH (ref 0.0–1.2)

## 2019-09-08 ENCOUNTER — Ambulatory Visit

## 2019-09-08 ENCOUNTER — Ambulatory Visit: Attending: Internal Medicine

## 2019-09-08 DIAGNOSIS — Z20822 Contact with and (suspected) exposure to covid-19: Secondary | ICD-10-CM

## 2019-09-08 NOTE — Addendum Note (Signed)
Addended by: Fayrene Helper on: 09/08/2019 09:54 AM   Modules accepted: Orders

## 2019-09-09 LAB — NOVEL CORONAVIRUS, NAA: SARS-CoV-2, NAA: NOT DETECTED

## 2019-09-09 LAB — SARS-COV-2, NAA 2 DAY TAT

## 2020-09-27 ENCOUNTER — Ambulatory Visit: Attending: Nurse Practitioner | Admitting: Nurse Practitioner

## 2020-09-27 ENCOUNTER — Other Ambulatory Visit: Payer: Self-pay

## 2020-09-27 ENCOUNTER — Other Ambulatory Visit (HOSPITAL_COMMUNITY)
Admission: RE | Admit: 2020-09-27 | Discharge: 2020-09-27 | Disposition: A | Discharge status: Home / Self Care | Source: Ambulatory Visit | Attending: Nurse Practitioner | Admitting: Nurse Practitioner

## 2020-09-27 ENCOUNTER — Encounter: Payer: Self-pay | Admitting: Nurse Practitioner

## 2020-09-27 VITALS — BP 114/76 | HR 83 | Resp 16 | Ht 70.5 in | Wt 193.6 lb

## 2020-09-27 DIAGNOSIS — Z0000 Encounter for general adult medical examination without abnormal findings: Secondary | ICD-10-CM | POA: Insufficient documentation

## 2020-09-27 DIAGNOSIS — Z7251 High risk heterosexual behavior: Secondary | ICD-10-CM

## 2020-09-27 DIAGNOSIS — Z1159 Encounter for screening for other viral diseases: Secondary | ICD-10-CM

## 2020-09-27 DIAGNOSIS — Z13220 Encounter for screening for lipoid disorders: Secondary | ICD-10-CM

## 2020-09-27 DIAGNOSIS — Z13228 Encounter for screening for other metabolic disorders: Secondary | ICD-10-CM

## 2020-09-27 DIAGNOSIS — Z114 Encounter for screening for human immunodeficiency virus [HIV]: Secondary | ICD-10-CM

## 2020-09-27 DIAGNOSIS — Z131 Encounter for screening for diabetes mellitus: Secondary | ICD-10-CM

## 2020-09-27 DIAGNOSIS — R17 Unspecified jaundice: Secondary | ICD-10-CM

## 2020-09-27 NOTE — Progress Notes (Signed)
Assessment & Plan:  Melvin Medina was seen today for annual exam.  Diagnoses and all orders for this visit:  Encounter for annual physical exam  Elevated bilirubin -     CMP14+EGFR -     CBC  Lipid screening -     Lipid panel  Screening for metabolic disorder  Encounter for screening for diabetes mellitus -     Hemoglobin A1c  Encounter for screening for HIV -     HIV antibody (with reflex)  Need for hepatitis C screening test -     HCV Ab w Reflex to Quant PCR  High risk heterosexual behavior -     Urine cytology ancillary only -     RPR   Patient has been counseled on age-appropriate routine health concerns for screening and prevention. These are reviewed and up-to-date. Referrals have been placed accordingly. Immunizations are up-to-date or declined.    Subjective:   Chief Complaint  Patient presents with   Annual Exam   HPI Melvin Medina 50 y.o. male presents to office today for annual physical. Requesting STD testing today. Denies any GU symptoms, chest pain, shortness of breath, palpitations, lightheadedness, dizziness, headaches or BLE edema.    Review of Systems  Constitutional:  Negative for fever, malaise/fatigue and weight loss.  HENT: Negative.  Negative for nosebleeds.   Eyes: Negative.  Negative for blurred vision, double vision and photophobia.  Respiratory: Negative.  Negative for cough and shortness of breath.   Cardiovascular: Negative.  Negative for chest pain, palpitations and leg swelling.  Gastrointestinal: Negative.  Negative for heartburn, nausea and vomiting.  Genitourinary: Negative.   Musculoskeletal: Negative.  Negative for myalgias.  Skin: Negative.   Neurological: Negative.  Negative for dizziness, focal weakness, seizures and headaches.  Endo/Heme/Allergies: Negative.   Psychiatric/Behavioral: Negative.  Negative for suicidal ideas.    Past Medical History:  Diagnosis Date   GERD (gastroesophageal reflux disease)    GI bleed      Past Surgical History:  Procedure Laterality Date   APPENDECTOMY      Family History  Problem Relation Age of Onset   Hypertension Mother     Social History Reviewed with no changes to be made today.   No outpatient medications prior to visit.   No facility-administered medications prior to visit.    Allergies  Allergen Reactions   Aspirin Other (See Comments)    ulcers       Objective:    BP 114/76   Pulse 83   Resp 16   Ht 5' 10.5" (1.791 m)   Wt 193 lb 9.6 oz (87.8 kg)   SpO2 96%   BMI 27.39 kg/m  Wt Readings from Last 3 Encounters:  09/27/20 193 lb 9.6 oz (87.8 kg)  06/16/19 195 lb (88.5 kg)  09/11/18 184 lb (83.5 kg)    Physical Exam Constitutional:      Appearance: He is well-developed.  HENT:     Head: Normocephalic and atraumatic.     Right Ear: Hearing, tympanic membrane, ear canal and external ear normal.     Left Ear: Hearing, tympanic membrane, ear canal and external ear normal.     Nose: Nose normal. No mucosal edema or rhinorrhea.     Mouth/Throat:     Mouth: Mucous membranes are moist.     Dentition: No dental caries, dental abscesses or gum lesions.     Pharynx: Uvula midline.     Tonsils: No tonsillar exudate. 1+ on the right. 1+ on  the left.  Eyes:     General: Lids are normal. No scleral icterus.    Conjunctiva/sclera: Conjunctivae normal.     Pupils: Pupils are equal, round, and reactive to light.  Neck:     Thyroid: No thyromegaly.     Trachea: No tracheal deviation.  Cardiovascular:     Rate and Rhythm: Normal rate and regular rhythm.     Heart sounds: Normal heart sounds. No murmur heard.   No friction rub. No gallop.  Pulmonary:     Effort: Pulmonary effort is normal. No respiratory distress.     Breath sounds: Normal breath sounds. No wheezing or rales.  Chest:     Chest wall: No mass or tenderness.  Breasts:    Right: No inverted nipple, mass, nipple discharge, skin change or tenderness.     Left: No inverted  nipple, mass, nipple discharge, skin change or tenderness.  Abdominal:     General: Bowel sounds are normal. There is no distension.     Palpations: Abdomen is soft. There is no mass.     Tenderness: There is no abdominal tenderness. There is no guarding or rebound.  Musculoskeletal:        General: No tenderness or deformity. Normal range of motion.     Cervical back: Normal range of motion and neck supple.  Lymphadenopathy:     Cervical: No cervical adenopathy.  Skin:    General: Skin is warm and dry.     Capillary Refill: Capillary refill takes less than 2 seconds.     Findings: No erythema.  Neurological:     Mental Status: He is alert and oriented to person, place, and time.     Cranial Nerves: No cranial nerve deficit.     Motor: No abnormal muscle tone.     Coordination: Coordination normal.     Deep Tendon Reflexes: Reflexes normal.  Psychiatric:        Behavior: Behavior normal.        Thought Content: Thought content normal.        Judgment: Judgment normal.         Patient has been counseled extensively about nutrition and exercise as well as the importance of adherence with medications and regular follow-up. The patient was given clear instructions to go to ER or return to medical center if symptoms don't improve, worsen or new problems develop. The patient verbalized understanding.   Follow-up: Return if symptoms worsen or fail to improve.   Gildardo Pounds, FNP-BC [facility] and [place], [place]   09/27/2020, 9:55 PM

## 2020-09-28 LAB — CBC
Hematocrit: 49.4 % (ref 37.5–51.0)
Hemoglobin: 16 g/dL (ref 13.0–17.7)
MCH: 29.2 pg (ref 26.6–33.0)
MCHC: 32.4 g/dL (ref 31.5–35.7)
MCV: 90 fL (ref 79–97)
Platelets: 262 10*3/uL (ref 150–450)
RBC: 5.48 x10E6/uL (ref 4.14–5.80)
RDW: 12.3 % (ref 11.6–15.4)
WBC: 4.4 10*3/uL (ref 3.4–10.8)

## 2020-09-28 LAB — CMP14+EGFR
ALT: 22 IU/L (ref 0–44)
AST: 18 IU/L (ref 0–40)
Albumin/Globulin Ratio: 1.7 (ref 1.2–2.2)
Albumin: 4.7 g/dL (ref 3.8–4.9)
Alkaline Phosphatase: 60 IU/L (ref 44–121)
BUN/Creatinine Ratio: 13 (ref 9–20)
BUN: 14 mg/dL (ref 6–24)
Bilirubin Total: 1.2 mg/dL (ref 0.0–1.2)
CO2: 26 mmol/L (ref 20–29)
Calcium: 9.7 mg/dL (ref 8.7–10.2)
Chloride: 101 mmol/L (ref 96–106)
Creatinine, Ser: 1.05 mg/dL (ref 0.76–1.27)
Globulin, Total: 2.8 g/dL (ref 1.5–4.5)
Glucose: 99 mg/dL (ref 65–99)
Potassium: 4.4 mmol/L (ref 3.5–5.2)
Sodium: 141 mmol/L (ref 134–144)
Total Protein: 7.5 g/dL (ref 6.0–8.5)
eGFR: 86 mL/min/{1.73_m2} (ref >59)

## 2020-09-28 LAB — URINE CYTOLOGY ANCILLARY ONLY
Candida Urine: NEGATIVE
Chlamydia: NEGATIVE
Comment: NEGATIVE
Comment: NEGATIVE
Comment: NORMAL
Neisseria Gonorrhea: NEGATIVE
Trichomonas: NEGATIVE

## 2020-09-28 LAB — HCV INTERPRETATION

## 2020-09-28 LAB — LIPID PANEL
Chol/HDL Ratio: 3 ratio (ref 0.0–5.0)
Cholesterol, Total: 161 mg/dL (ref 100–199)
HDL: 54 mg/dL (ref >39)
LDL Chol Calc (NIH): 96 mg/dL (ref 0–99)
Triglycerides: 55 mg/dL (ref 0–149)
VLDL Cholesterol Cal: 11 mg/dL (ref 5–40)

## 2020-09-28 LAB — HCV AB W REFLEX TO QUANT PCR: HCV Ab: <0.1 s/co ratio (ref 0.0–0.9)

## 2020-09-28 LAB — RPR: RPR Ser Ql: NONREACTIVE

## 2020-09-28 LAB — HEMOGLOBIN A1C
Est. average glucose Bld gHb Est-mCnc: 111 mg/dL
Hgb A1c MFr Bld: 5.5 % (ref 4.8–5.6)

## 2020-09-28 LAB — HIV ANTIBODY (ROUTINE TESTING W REFLEX): HIV Screen 4th Generation wRfx: NONREACTIVE

## 2020-10-01 ENCOUNTER — Telehealth: Payer: Self-pay

## 2020-10-01 NOTE — Telephone Encounter (Signed)
Pt was called and informed of lab results. 

## 2020-11-08 ENCOUNTER — Encounter: Admitting: Nurse Practitioner

## 2021-10-02 NOTE — Progress Notes (Signed)
Established Patient Office Visit  Subjective   Patient ID: Melvin Medina, male    DOB: April 27, 1970  Age: 51 y.o. MRN: [id number]  Chief Complaint  Patient presents with   Bloated    Patient was seen in follow-up primary care provider is Ms. Raul Del.  Patient last seen in July 2022.  He complains of bloating and excess belching and reflux symptoms.  He states the proton pump inhibitor does not help.  Also has a white discharge in the penis when he has a bowel movement.  He has not had any kind of intercourse in over a year.  He works as a Astronomer.  He does not drink alcohol or smoke cigarettes.  He does eat a lot of rice but it is brown rice.  Patient stated he does have a lot of urination and also has constipation he has no other complaints.      Patient Active Problem List   Diagnosis Date Noted   Screen for STD (sexually transmitted disease) 10/03/2021   Polyuria 10/03/2021   Back pain of thoracolumbar region 10/14/2013   Abdominal bloating 10/14/2013   GERD (gastroesophageal reflux disease) 10/14/2013   Past Medical History:  Diagnosis Date   GERD (gastroesophageal reflux disease)    GI bleed    Past Surgical History:  Procedure Laterality Date   APPENDECTOMY     Social History   Tobacco Use   Smoking status: Never   Smokeless tobacco: Never  Vaping Use   Vaping Use: Never used  Substance Use Topics   Alcohol use: No   Drug use: No   Social History   Socioeconomic History   Marital status: Married    Spouse name: Not on file   Number of children: Not on file   Years of education: Not on file   Highest education level: Not on file  Occupational History   Not on file  Tobacco Use   Smoking status: Never   Smokeless tobacco: Never  Vaping Use   Vaping Use: Never used  Substance and Sexual Activity   Alcohol use: No   Drug use: No   Sexual activity: Never  Other Topics Concern   Not on file  Social History Narrative   Not on file   Social  Determinants of Health   Financial Resource Strain: Not on file  Food Insecurity: Not on file  Transportation Needs: Not on file  Physical Activity: Not on file  Stress: Not on file  Social Connections: Not on file  Intimate Partner Violence: Not on file   Family Status  Relation Name Status   Mother  (Not Specified)   Family History  Problem Relation Age of Onset   Hypertension Mother    Allergies  Allergen Reactions   Aspirin Other (See Comments)    ulcers      Review of Systems  Constitutional:  Negative for chills, diaphoresis, fever, malaise/fatigue and weight loss.  HENT:  Negative for congestion, hearing loss, nosebleeds, sore throat and tinnitus.   Eyes:  Negative for blurred vision, photophobia and redness.  Respiratory:  Negative for cough, hemoptysis, sputum production, shortness of breath, wheezing and stridor.   Cardiovascular:  Negative for chest pain, palpitations, orthopnea, claudication, leg swelling and PND.  Gastrointestinal:  Positive for abdominal pain, constipation and heartburn. Negative for blood in stool, diarrhea, nausea and vomiting.  Genitourinary:  Negative for dysuria, flank pain, frequency, hematuria and urgency.       Penile discharge and polyuria  Musculoskeletal:  Negative for back pain, falls, joint pain, myalgias and neck pain.  Skin:  Negative for itching and rash.  Neurological:  Negative for dizziness, tingling, tremors, sensory change, speech change, focal weakness, seizures, loss of consciousness, weakness and headaches.  Endo/Heme/Allergies:  Negative for environmental allergies and polydipsia. Does not bruise/bleed easily.  Psychiatric/Behavioral:  Negative for depression, memory loss, substance abuse and suicidal ideas. The patient is not nervous/anxious and does not have insomnia.       Objective:     BP 108/78   Pulse 63   Wt 187 lb 9.6 oz (85.1 kg)   SpO2 97%   BMI 26.54 kg/m  BP Readings from Last 3 Encounters:   10/03/21 108/78  09/27/20 114/76  06/16/19 108/66   Wt Readings from Last 3 Encounters:  10/03/21 187 lb 9.6 oz (85.1 kg)  09/27/20 193 lb 9.6 oz (87.8 kg)  06/16/19 195 lb (88.5 kg)      Physical Exam Vitals reviewed. Exam conducted with a chaperone present.  Constitutional:      Appearance: Normal appearance. He is well-developed. He is not diaphoretic.  HENT:     Head: Normocephalic and atraumatic.     Nose: No nasal deformity, septal deviation, mucosal edema or rhinorrhea.     Right Sinus: No maxillary sinus tenderness or frontal sinus tenderness.     Left Sinus: No maxillary sinus tenderness or frontal sinus tenderness.     Mouth/Throat:     Pharynx: No oropharyngeal exudate.  Eyes:     General: No scleral icterus.    Conjunctiva/sclera: Conjunctivae normal.     Pupils: Pupils are equal, round, and reactive to light.  Neck:     Thyroid: No thyromegaly.     Vascular: No carotid bruit or JVD.     Trachea: Trachea normal. No tracheal tenderness or tracheal deviation.  Cardiovascular:     Rate and Rhythm: Normal rate and regular rhythm.     Chest Wall: PMI is not displaced.     Pulses: Normal pulses. No decreased pulses.     Heart sounds: Normal heart sounds, S1 normal and S2 normal. Heart sounds not distant. No murmur heard.    No systolic murmur is present.     No diastolic murmur is present.     No friction rub. No gallop. No S3 or S4 sounds.  Pulmonary:     Effort: Pulmonary effort is normal. No tachypnea, accessory muscle usage or respiratory distress.     Breath sounds: Normal breath sounds. No stridor. No decreased breath sounds, wheezing, rhonchi or rales.  Chest:     Chest wall: No tenderness.  Abdominal:     General: Bowel sounds are normal. There is no distension.     Palpations: Abdomen is soft. Abdomen is not rigid. There is no mass.     Tenderness: There is no abdominal tenderness. There is no right CVA tenderness, left CVA tenderness, guarding or  rebound.     Hernia: No hernia is present.  Genitourinary:    Penis: Normal.      Testes: Normal.  Musculoskeletal:        General: Normal range of motion.     Cervical back: Normal range of motion and neck supple. No edema, erythema or rigidity. No muscular tenderness. Normal range of motion.  Lymphadenopathy:     Head:     Right side of head: No submental or submandibular adenopathy.     Left side of head: No submental or submandibular adenopathy.  Cervical: No cervical adenopathy.  Skin:    General: Skin is warm and dry.     Coloration: Skin is not pale.     Findings: No rash.     Nails: There is no clubbing.  Neurological:     Mental Status: He is alert and oriented to person, place, and time.     Sensory: No sensory deficit.  Psychiatric:        Speech: Speech normal.        Behavior: Behavior normal.      No results found for any visits on 10/03/21.  Last CBC Lab Results  Component Value Date   WBC 4.4 09/27/2020   HGB 16.0 09/27/2020   HCT 49.4 09/27/2020   MCV 90 09/27/2020   MCH 29.2 09/27/2020   RDW 12.3 09/27/2020   PLT 262 51/88/4166   Last metabolic panel Lab Results  Component Value Date   GLUCOSE 99 09/27/2020   NA 141 09/27/2020   K 4.4 09/27/2020   CL 101 09/27/2020   CO2 26 09/27/2020   BUN 14 09/27/2020   CREATININE 1.05 09/27/2020   EGFR 86 09/27/2020   CALCIUM 9.7 09/27/2020   PROT 7.5 09/27/2020   ALBUMIN 4.7 09/27/2020   LABGLOB 2.8 09/27/2020   AGRATIO 1.7 09/27/2020   BILITOT 1.2 09/27/2020   ALKPHOS 60 09/27/2020   AST 18 09/27/2020   ALT 22 09/27/2020   ANIONGAP 8 09/11/2018   Last lipids Lab Results  Component Value Date   CHOL 161 09/27/2020   HDL 54 09/27/2020   LDLCALC 96 09/27/2020   TRIG 55 09/27/2020   CHOLHDL 3.0 09/27/2020   Last hemoglobin A1c Lab Results  Component Value Date   HGBA1C 5.5 09/27/2020   Last thyroid functions Lab Results  Component Value Date   TSH 3.852 09/11/2018   T4TOTAL 6.0  02/21/2018      The 10-year ASCVD risk score (Arnett DK, et al., 2019) is: 4%    Assessment & Plan:   Problem List Items Addressed This Visit       Digestive   GERD (gastroesophageal reflux disease)    Patient with ongoing heartburn indigestion and excessive bloating  Plan to check metabolic panel liver function H. pylori breath test  Patient given a lifestyle medicine handout for healthy diet The following Lifestyle Medicine recommendations according to [place] of Lifestyle Medicine [facility]) were discussed and offered to patient who agrees to start the journey:  A. Whole Foods, Plant-based plate comprising of fruits and vegetables, plant-based proteins, whole-grain carbohydrates was discussed in detail with the patient.   A list for source of those nutrients were also provided to the patient.  Patient will use only water or unsweetened tea for hydration. B.  The need to stay away from risky substances including alcohol, smoking; obtaining 7 to 9 hours of restorative sleep, at least 150 minutes of moderate intensity exercise weekly, the importance of healthy social connections,  and stress reduction techniques were discussed.  We will check H. pylori breath test Begin Pepcid 40 mg daily and discontinue PPIs        Relevant Medications   Simethicone 125 MG CAPS   famotidine (PEPCID) 40 MG tablet   Other Relevant Orders   Comprehensive metabolic panel   H. pylori breath test   CBC with Differential/Platelet     Other   Abdominal bloating    Abdominal bloating with associated constipation hopefully with improved lifestyle medicine diet with a plant-based diet his GI  complaints will be less  We will also see how he does with Pepcid and simethicone      Screen for STD (sexually transmitted disease)    Perform serum and urine STD screening      Relevant Orders   Lipid panel   HCV Ab w Reflex to Quant PCR   HIV Antibody (routine testing w rflx)   RPR   Polyuria     Check urinalysis rescreen for diabetes      Relevant Orders   Comprehensive metabolic panel   Hemoglobin A1c   Other Visit Diagnoses     Penile discharge    -  Primary   Relevant Orders   Cytology - non pap   Urinalysis   Urine Culture   Encounter for health-related screening           Return in about 4 months (around 02/03/2022) for Ms Raul Del in 4 months.    Asencion Noble, MD

## 2021-10-03 ENCOUNTER — Encounter: Payer: Self-pay | Admitting: Critical Care Medicine

## 2021-10-03 ENCOUNTER — Ambulatory Visit: Attending: Critical Care Medicine | Admitting: Critical Care Medicine

## 2021-10-03 VITALS — BP 108/78 | HR 63 | Wt 187.6 lb

## 2021-10-03 DIAGNOSIS — R3589 Other polyuria: Secondary | ICD-10-CM

## 2021-10-03 DIAGNOSIS — Z113 Encounter for screening for infections with a predominantly sexual mode of transmission: Secondary | ICD-10-CM

## 2021-10-03 DIAGNOSIS — R140 Abdominal distension (gaseous): Secondary | ICD-10-CM

## 2021-10-03 DIAGNOSIS — K219 Gastro-esophageal reflux disease without esophagitis: Secondary | ICD-10-CM | POA: Diagnosis not present

## 2021-10-03 DIAGNOSIS — R369 Urethral discharge, unspecified: Secondary | ICD-10-CM

## 2021-10-03 DIAGNOSIS — Z139 Encounter for screening, unspecified: Secondary | ICD-10-CM

## 2021-10-03 MED ORDER — FAMOTIDINE 40 MG PO TABS: 40.0000 mg | ORAL_TABLET | Freq: Every day | ORAL | 3 refills | Status: DC

## 2021-10-03 MED ORDER — SIMETHICONE 125 MG PO CAPS: ORAL_CAPSULE | ORAL | 0 refills | Status: DC

## 2021-10-03 NOTE — Assessment & Plan Note (Signed)
Perform serum and urine STD screening

## 2021-10-03 NOTE — Patient Instructions (Signed)
Complete set of screening labs will be obtained at this visit including urine studies and screening for sexual transmitted diseases and also will check you again for H. pylori with a breath test this is a bacteria that can overgrow in the stomach to cause stomach difficulties  Begin Pepcid 40 mg daily for bloating  Follow lifestyle medicine handout with regards to diet  We will call you with lab results  Return to see your primary care provider Ms. Fleming in 4 months

## 2021-10-03 NOTE — Assessment & Plan Note (Addendum)
Patient with ongoing heartburn indigestion and excessive bloating  Plan to check metabolic panel liver function H. pylori breath test  Patient given a lifestyle medicine handout for healthy diet The following Lifestyle Medicine recommendations according to [employer] of Lifestyle Medicine [facility]) were discussed and offered to patient who agrees to start the journey:  A. Whole Foods, Plant-based plate comprising of fruits and vegetables, plant-based proteins, whole-grain carbohydrates was discussed in detail with the patient.   A list for source of those nutrients were also provided to the patient.  Patient will use only water or unsweetened tea for hydration. B.  The need to stay away from risky substances including alcohol, smoking; obtaining 7 to 9 hours of restorative sleep, at least 150 minutes of moderate intensity exercise weekly, the importance of healthy social connections,  and stress reduction techniques were discussed.  We will check H. pylori breath test Begin Pepcid 40 mg daily and discontinue PPIs

## 2021-10-03 NOTE — Assessment & Plan Note (Signed)
Check urinalysis rescreen for diabetes

## 2021-10-03 NOTE — Assessment & Plan Note (Addendum)
Abdominal bloating with associated constipation hopefully with improved lifestyle medicine diet with a plant-based diet his GI complaints will be less  We will also see how he does with Pepcid and simethicone

## 2021-10-04 ENCOUNTER — Other Ambulatory Visit (HOSPITAL_COMMUNITY)
Admission: RE | Admit: 2021-10-04 | Discharge: 2021-10-04 | Disposition: A | Discharge status: Home / Self Care | Source: Ambulatory Visit | Attending: Critical Care Medicine | Admitting: Critical Care Medicine

## 2021-10-04 ENCOUNTER — Telehealth: Payer: Self-pay

## 2021-10-04 DIAGNOSIS — R369 Urethral discharge, unspecified: Secondary | ICD-10-CM | POA: Diagnosis present

## 2021-10-04 LAB — URINALYSIS
Bilirubin, UA: NEGATIVE
Glucose, UA: NEGATIVE
Ketones, UA: NEGATIVE
Leukocytes,UA: NEGATIVE
Nitrite, UA: NEGATIVE
Protein,UA: NEGATIVE
RBC, UA: NEGATIVE
Specific Gravity, UA: 1.018 (ref 1.005–1.030)
Urobilinogen, Ur: 0.2 mg/dL (ref 0.2–1.0)
pH, UA: 6 (ref 5.0–7.5)

## 2021-10-04 LAB — RPR: RPR Ser Ql: NONREACTIVE

## 2021-10-04 LAB — LIPID PANEL
Chol/HDL Ratio: 2.6 ratio (ref 0.0–5.0)
Cholesterol, Total: 136 mg/dL (ref 100–199)
HDL: 53 mg/dL (ref >39)
LDL Chol Calc (NIH): 70 mg/dL (ref 0–99)
Triglycerides: 60 mg/dL (ref 0–149)
VLDL Cholesterol Cal: 13 mg/dL (ref 5–40)

## 2021-10-04 LAB — CBC WITH DIFFERENTIAL/PLATELET
Basophils Absolute: 0 10*3/uL (ref 0.0–0.2)
Basos: 1 %
EOS (ABSOLUTE): 0.1 10*3/uL (ref 0.0–0.4)
Eos: 2 %
Hematocrit: 47.4 % (ref 37.5–51.0)
Hemoglobin: 15.7 g/dL (ref 13.0–17.7)
Immature Grans (Abs): 0 10*3/uL (ref 0.0–0.1)
Immature Granulocytes: 0 %
Lymphocytes Absolute: 1.7 10*3/uL (ref 0.7–3.1)
Lymphs: 51 %
MCH: 29.6 pg (ref 26.6–33.0)
MCHC: 33.1 g/dL (ref 31.5–35.7)
MCV: 89 fL (ref 79–97)
Monocytes Absolute: 0.4 10*3/uL (ref 0.1–0.9)
Monocytes: 13 %
Neutrophils Absolute: 1.1 10*3/uL — ABNORMAL LOW (ref 1.4–7.0)
Neutrophils: 33 %
Platelets: 257 10*3/uL (ref 150–450)
RBC: 5.3 x10E6/uL (ref 4.14–5.80)
RDW: 12.5 % (ref 11.6–15.4)
WBC: 3.3 10*3/uL — ABNORMAL LOW (ref 3.4–10.8)

## 2021-10-04 LAB — HEMOGLOBIN A1C
Est. average glucose Bld gHb Est-mCnc: 108 mg/dL
Hgb A1c MFr Bld: 5.4 % (ref 4.8–5.6)

## 2021-10-04 LAB — COMPREHENSIVE METABOLIC PANEL
ALT: 27 IU/L (ref 0–44)
AST: 21 IU/L (ref 0–40)
Albumin/Globulin Ratio: 1.7 (ref 1.2–2.2)
Albumin: 4.6 g/dL (ref 3.8–4.9)
Alkaline Phosphatase: 58 IU/L (ref 44–121)
BUN/Creatinine Ratio: 12 (ref 9–20)
BUN: 13 mg/dL (ref 6–24)
Bilirubin Total: 1.4 mg/dL — ABNORMAL HIGH (ref 0.0–1.2)
CO2: 26 mmol/L (ref 20–29)
Calcium: 9.5 mg/dL (ref 8.7–10.2)
Chloride: 104 mmol/L (ref 96–106)
Creatinine, Ser: 1.12 mg/dL (ref 0.76–1.27)
Globulin, Total: 2.7 g/dL (ref 1.5–4.5)
Glucose: 95 mg/dL (ref 70–99)
Potassium: 4.4 mmol/L (ref 3.5–5.2)
Sodium: 142 mmol/L (ref 134–144)
Total Protein: 7.3 g/dL (ref 6.0–8.5)
eGFR: 79 mL/min/{1.73_m2} (ref >59)

## 2021-10-04 LAB — HCV AB W REFLEX TO QUANT PCR: HCV Ab: NONREACTIVE

## 2021-10-04 LAB — H. PYLORI BREATH TEST: H pylori Breath Test: NEGATIVE

## 2021-10-04 LAB — HCV INTERPRETATION

## 2021-10-04 LAB — HIV ANTIBODY (ROUTINE TESTING W REFLEX): HIV Screen 4th Generation wRfx: NONREACTIVE

## 2021-10-04 NOTE — Progress Notes (Signed)
Let pt know liver kidney normal, blood count normal, hiv neg, syphillis neg, urine normal no bladder/kidney  infection, no diabetes, cholesterol is NORMAL, hep C neg

## 2021-10-04 NOTE — Telephone Encounter (Signed)
Pt was called and is aware of results, DOB was confirmed.  ?

## 2021-10-04 NOTE — Addendum Note (Signed)
Addended by: Shan Levans E on: 10/04/2021 10:41 AM   Modules accepted: Orders

## 2021-10-04 NOTE — Telephone Encounter (Signed)
-----   Message from Storm Frisk, MD sent at 10/04/2021  7:04 AM EDT ----- Let pt know liver kidney normal, blood count normal, hiv neg, syphillis neg, urine normal no bladder/kidney  infection, no diabetes, cholesterol is NORMAL, hep C neg

## 2021-10-05 ENCOUNTER — Telehealth: Payer: Self-pay

## 2021-10-05 LAB — URINE CULTURE: Organism ID, Bacteria: NO GROWTH

## 2021-10-05 LAB — CERVICOVAGINAL ANCILLARY ONLY
Chlamydia: NEGATIVE
Comment: NEGATIVE
Comment: NORMAL
Neisseria Gonorrhea: NEGATIVE

## 2021-10-05 NOTE — Telephone Encounter (Signed)
Pt was called and is aware of results, DOB was confirmed.  ?

## 2021-10-05 NOTE — Telephone Encounter (Signed)
-----   Message from Storm Frisk, MD sent at 10/05/2021  1:30 PM EDT ----- Let pt know all STD tests NEGATIVE no communicable diseases.  H pylori neg

## 2022-02-06 ENCOUNTER — Ambulatory Visit: Admitting: Nurse Practitioner

## 2022-06-15 ENCOUNTER — Ambulatory Visit: Payer: BLUE CROSS/BLUE SHIELD | Attending: Physician Assistant | Admitting: Physician Assistant

## 2022-06-15 ENCOUNTER — Encounter: Payer: Self-pay | Admitting: Physician Assistant

## 2022-06-15 VITALS — BP 120/74 | HR 81 | Ht 70.5 in | Wt 194.0 lb

## 2022-06-15 DIAGNOSIS — J301 Allergic rhinitis due to pollen: Secondary | ICD-10-CM

## 2022-06-15 DIAGNOSIS — R140 Abdominal distension (gaseous): Secondary | ICD-10-CM | POA: Diagnosis not present

## 2022-06-15 DIAGNOSIS — Z09 Encounter for follow-up examination after completed treatment for conditions other than malignant neoplasm: Secondary | ICD-10-CM

## 2022-06-15 DIAGNOSIS — R0600 Dyspnea, unspecified: Secondary | ICD-10-CM

## 2022-06-15 DIAGNOSIS — K219 Gastro-esophageal reflux disease without esophagitis: Secondary | ICD-10-CM | POA: Diagnosis not present

## 2022-06-15 MED ORDER — PANTOPRAZOLE SODIUM 40 MG PO TBEC: 40.0000 mg | DELAYED_RELEASE_TABLET | Freq: Every day | ORAL | 3 refills | Status: DC

## 2022-06-15 MED ORDER — FLUTICASONE PROPIONATE 50 MCG/ACT NA SUSP: 2.0000 | Freq: Every day | NASAL | 6 refills | Status: DC

## 2022-06-15 MED ORDER — MONTELUKAST SODIUM 10 MG PO TABS: 10.0000 mg | ORAL_TABLET | Freq: Every day | ORAL | 3 refills | Status: DC

## 2022-06-15 NOTE — Patient Instructions (Signed)
Probiotics Probiotics are the good bacteria and yeasts that live in your body and keep your digestive system healthy. Probiotics also help your body's defense system (immune system) and protect your body against the growth of harmful bacteria. Your health care provider may recommend taking a probiotic if you are taking antibiotic medicine or have certain medical conditions, such as: Diarrhea. Constipation. Irritable bowel syndrome. Lung infections. Yeast infections. Acne, eczema, and other skin conditions. Frequent urinary tract infections. What affects the balance of bacteria in my body? The balance of good bacteria in your body can be affected by: Antibiotics. These medicines treat infections caused by bacteria. Unfortunately, they may kill the good bacteria in your body as well as the bad bacteria. Certain medical conditions. Conditions related to an imbalance of bacteria include: Stomach and intestine (gastrointestinal) infections. Lung infections. Skin infections. Vaginal infections. Inflammatory bowel diseases. Stomach ulcers (gastric ulcers). Tooth decay and gum disease (periodontal disease). Stress. A low-fiber diet. What type of probiotic is right for me? Probiotics contain different types of bacteria (strains). Strains commonly found in probiotics include: Lactobacillus. Saccharomyces. Bifidobacterium. Specific strains have been shown to be more effective for certain health conditions. Ask your health care provider which strain or strains you should use and how often. Probiotics come in many different forms, strain combinations, and strengths. Some may need to be refrigerated. Always read the label for storage and usage instructions. Certain foods, such as yogurt, contain probiotics. Probiotics can also be bought as a supplement at a pharmacy, health food store, or grocery store. Talk to your health care provider before starting any supplement. What are the side effects of  probiotics? Some people have side effects when taking probiotics. Side effects are usually temporary and may include: Gas. Bloating. Cramping. Serious side effects are rare. Follow these instructions at home:  Eat foods high in fiber, such as whole grains, beans, and vegetables. These foods can help good bacteria grow. Avoid certain foods as told by your health care provider. If you are taking probiotics with antibiotic medicine, take your probiotics as told by your health care provider. You may have to take probiotics for several weeks. This is to help the growth of good bacteria in your gut. Where to find more information International Scientific Association for Probiotics and Prebiotics: isappscience.org Summary Probiotics are the good bacteria and yeasts that live in your body and keep you and your digestive system healthy. Certain foods, such as yogurt, contain probiotics. Probiotics can be taken as supplements. They can be bought at a pharmacy, health food store, or grocery store. They come in many different forms, strain combinations, and strengths. Be sure to talk with your health care provider before taking a probiotic supplement. This information is not intended to replace advice given to you by your health care provider. Make sure you discuss any questions you have with your health care provider. Document Revised: 12/15/2020 Document Reviewed: 12/15/2020 Elsevier Patient Education  2023 Elsevier Inc.  

## 2022-06-15 NOTE — Progress Notes (Signed)
Patient ID: Melvin Medina, male   DOB: 30-Jan-1971, 52 y.o.   MRN: [id number]     Melvin Medina, is a 52 y.o. male  [id number]  [id number]  DOB - Nov 20, 1970  Chief Complaint  Patient presents with   Bloated       Subjective:   Melvin Medina is a 52 y.o. male here today for a follow up visit after being Seen at ED in [facility] 05/31/2022 with abdominal pain and SOB.  EKG, CXR, and CT abd/pelvis without acute findings.  Labs including CMP, CBC, lipase, and magnesium essentially normal.  He wants to see a lung specialist.  He is very active and exercises and eats a healthy diet.  He has never had to use inhalers.  He does NOT have SOB when he exercises.  He does not smoke or drink alcohol.  The symptoms he is having he has had on and off since 2019.  No new sexual partners.  No melena or hematochezia.  He is UTD on colonoscopy.  Bowels are normal.  Also concerned about bilirubin but his bilirubin was normal 05/31/2022.  No urinary s/sx. He would like to see a lung specialist  From ED HPI: 52 y.o. male presents with complaints of intermittent generalized abdominal pain, bloating primarily after eating or drinking anything x 2 months. Rates pain 10/10, described as an ache. States he is also had shortness of breath intermittently x a couple months. Denies chest pain, palpitations, headaches, dizziness, lower extremity swelling. Denies recent viral illness. Also reports intermittent burning with urination, denies increased urinary frequency, hematuria. Last BM today, normal for him. Denies alcohol, tobacco, illicit drug use.  Patient seen and received a screening examination in triage. Appropriate orders have been initiated based on my brief physical exam and HPI. Patient placed in the sub-waiting area until a treatment room comes available for further evaluation and management in the main ED.     No problems updated.  ALLERGIES: Allergies  Allergen Reactions   Aspirin  Other (See Comments)    ulcers    PAST MEDICAL HISTORY: Past Medical History:  Diagnosis Date   GERD (gastroesophageal reflux disease)    GI bleed     MEDICATIONS AT HOME: Prior to Admission medications   Medication Sig Start Date End Date Taking? Authorizing Provider  fluticasone (FLONASE) 50 MCG/ACT nasal spray Place 2 sprays into both nostrils daily. 06/15/22  Yes Florita Nitsch M, PA-C  montelukast (SINGULAIR) 10 MG tablet Take 1 tablet (10 mg total) by mouth at bedtime. 06/15/22  Yes Freeman Caldron M, PA-C  pantoprazole (PROTONIX) 40 MG tablet Take 1 tablet (40 mg total) by mouth daily. 06/15/22  Yes Argentina Donovan, PA-C  Simethicone 125 MG CAPS Take one 4 times daily for gas Patient not taking: Reported on 06/15/2022 10/03/21   Elsie Stain, MD    ROS: Neg cardiac Neg GU Neg MS Neg psych Neg neuro  Objective:   Vitals:   06/15/22 1508  BP: 120/74  Pulse: 81  SpO2: 98%  Weight: 194 lb (88 kg)  Height: 5' 10.5" (1.791 m)   Exam General appearance : Awake, alert, not in any distress. Speech Clear. Not toxic looking HEENT: Atraumatic and Normocephalic.  Nasal turbinates are enlarged and boggy Neck: Supple, no JVD. No cervical lymphadenopathy.  Chest: Good air entry bilaterally, CTAB.  No rales/rhonchi/wheezing CVS: S1 S2 regular, no murmurs.  Abdomen: Bowel sounds present, Non tender and not distended with no gaurding, rigidity or rebound. Extremities: B/L Lower  Ext shows no edema, both legs are warm to touch Neurology: Awake alert, and oriented X 3, CN II-XII intact, Non focal Skin: No Rash  Data Review Lab Results  Component Value Date   HGBA1C 5.4 10/03/2021   HGBA1C 5.5 09/27/2020   HGBA1C 5.5 05/14/2019    Assessment & Plan   1. Gastroesophageal reflux disease without esophagitis - H. pylori breath test - pantoprazole (PROTONIX) 40 MG tablet; Take 1 tablet (40 mg total) by mouth daily.  Dispense: 60 each; Refill: 3  2. Abdominal bloating Says  simethicone did not help.  Imaging was WNL.  UTD on colonoscopy.  Labs reassuring - H. pylori breath test - pantoprazole (PROTONIX) 40 MG tablet; Take 1 tablet (40 mg total) by mouth daily.  Dispense: 60 each; Refill: 3  3. Dyspnea, unspecified type - montelukast (SINGULAIR) 10 MG tablet; Take 1 tablet (10 mg total) by mouth at bedtime.  Dispense: 30 tablet; Refill: 3 Refer pulmonology  4. Seasonal allergic rhinitis due to pollen - fluticasone (FLONASE) 50 MCG/ACT nasal spray; Place 2 sprays into both nostrils daily.  Dispense: 16 g; Refill: 6 - montelukast (SINGULAIR) 10 MG tablet; Take 1 tablet (10 mg total) by mouth at bedtime.  Dispense: 30 tablet; Refill: 3  5. Hospital discharge follow-up    Return in about 3 months (around 09/14/2022) for PCP for chronic conditions.  The patient was given clear instructions to go to ER or return to medical center if symptoms don't improve, worsen or new problems develop. The patient verbalized understanding. The patient was told to call to get lab results if they haven't heard anything in the next week.      Freeman Caldron, PA-C [facility] and [place], [place]   06/15/2022, 3:52 PM

## 2022-06-17 LAB — H. PYLORI BREATH TEST: H pylori Breath Test: POSITIVE — AB

## 2022-06-19 ENCOUNTER — Other Ambulatory Visit: Payer: Self-pay | Admitting: Physician Assistant

## 2022-06-19 MED ORDER — AMOXICILLIN 500 MG PO CAPS: 1000.0000 mg | ORAL_CAPSULE | Freq: Two times a day (BID) | ORAL | 0 refills | Status: AC

## 2022-06-19 MED ORDER — CLARITHROMYCIN 500 MG PO TABS: 500.0000 mg | ORAL_TABLET | Freq: Two times a day (BID) | ORAL | 0 refills | Status: DC

## 2022-06-23 ENCOUNTER — Telehealth (INDEPENDENT_AMBULATORY_CARE_PROVIDER_SITE_OTHER): Payer: Self-pay

## 2022-06-23 NOTE — Telephone Encounter (Signed)
-----   Message from Guy Franco, RN sent at 06/23/2022 12:03 PM EDT -----  ----- Message ----- From: Anders Simmonds, PA-C Sent: 06/19/2022  10:49 AM EDT To: Guy Franco, RN  You tested positive for the bacteria that causes stomach ulcers.  I sent 2 antibiotics to take for the next 2 weeks.  MAKE SURE you are also taking the pantoprazole daily for at least the next month.  Thanks, Georgian Co, PA-C

## 2022-06-23 NOTE — Telephone Encounter (Signed)
Called patient unable to leave voicemail or make contact

## 2022-07-12 ENCOUNTER — Institutional Professional Consult (permissible substitution): Payer: BLUE CROSS/BLUE SHIELD | Admitting: Pulmonary Disease

## 2022-07-12 ENCOUNTER — Encounter: Payer: Self-pay | Admitting: Pulmonary Disease

## 2022-08-01 ENCOUNTER — Other Ambulatory Visit: Payer: Self-pay | Admitting: Nurse Practitioner

## 2022-08-01 ENCOUNTER — Telehealth: Payer: Self-pay | Admitting: Nurse Practitioner

## 2022-08-01 NOTE — Telephone Encounter (Signed)
Copied from CRM [id number]. Topic: Appointment Scheduling - Scheduling Inquiry for Clinic >> Aug 01, 2022  1:44 PM Melvin Medina wrote: Reason for CRM: Pt is calling requesting Medina physical before he has to leave to go [place]. Per pt he is leaving on June 1st and is needing an appointment before then. Pt would like to see if he can get an appt this week so that his blood work will have time to come back before he leave on 08/12/22.  Please call pt back to discuss.

## 2022-08-01 NOTE — Telephone Encounter (Signed)
Labs pending for tomorrow.

## 2022-08-02 ENCOUNTER — Ambulatory Visit: Payer: BLUE CROSS/BLUE SHIELD | Attending: Nurse Practitioner

## 2022-08-02 ENCOUNTER — Other Ambulatory Visit: Payer: Self-pay | Admitting: Nurse Practitioner

## 2022-08-02 ENCOUNTER — Other Ambulatory Visit (HOSPITAL_COMMUNITY)
Admission: RE | Admit: 2022-08-02 | Discharge: 2022-08-02 | Disposition: A | Discharge status: Home / Self Care | Source: Ambulatory Visit | Attending: Nurse Practitioner | Admitting: Nurse Practitioner

## 2022-08-02 DIAGNOSIS — Z113 Encounter for screening for infections with a predominantly sexual mode of transmission: Secondary | ICD-10-CM | POA: Diagnosis present

## 2022-08-02 DIAGNOSIS — R140 Abdominal distension (gaseous): Secondary | ICD-10-CM

## 2022-08-02 DIAGNOSIS — K219 Gastro-esophageal reflux disease without esophagitis: Secondary | ICD-10-CM

## 2022-08-03 LAB — URINE CYTOLOGY ANCILLARY ONLY
Chlamydia: NEGATIVE
Comment: NEGATIVE
Comment: NEGATIVE
Comment: NORMAL
Neisseria Gonorrhea: NEGATIVE
Trichomonas: NEGATIVE

## 2022-08-03 LAB — HIV ANTIBODY (ROUTINE TESTING W REFLEX): HIV Screen 4th Generation wRfx: NONREACTIVE

## 2022-08-04 ENCOUNTER — Telehealth: Payer: Self-pay | Admitting: Nurse Practitioner

## 2022-08-04 LAB — H. PYLORI BREATH TEST: H pylori Breath Test: NEGATIVE

## 2022-08-04 NOTE — Telephone Encounter (Signed)
Copied from CRM [id number]. Topic: General - Other >> Aug 04, 2022  4:06 PM Dominique A wrote: Reason for CRM: Pt is calling back about regarding his stomach bloating, pt states that he was seen two days ago and was told that he would get a call back and no one has called him back. Please advise.

## 2022-08-08 NOTE — Telephone Encounter (Signed)
Patient identified by name and date of birth.   Patient aware of results and voiced understanding.   

## 2022-09-19 ENCOUNTER — Ambulatory Visit: Payer: BLUE CROSS/BLUE SHIELD | Admitting: Nurse Practitioner

## 2023-08-24 ENCOUNTER — Ambulatory Visit: Attending: Nurse Practitioner | Admitting: Nurse Practitioner

## 2023-08-24 ENCOUNTER — Other Ambulatory Visit (HOSPITAL_COMMUNITY)
Admission: RE | Admit: 2023-08-24 | Discharge: 2023-08-24 | Disposition: A | Discharge status: Home / Self Care | Source: Ambulatory Visit | Attending: Nurse Practitioner | Admitting: Nurse Practitioner

## 2023-08-24 ENCOUNTER — Encounter: Payer: Self-pay | Admitting: Nurse Practitioner

## 2023-08-24 VITALS — BP 108/70 | HR 70 | Resp 19 | Ht 70.5 in | Wt 188.2 lb

## 2023-08-24 DIAGNOSIS — Z7251 High risk heterosexual behavior: Secondary | ICD-10-CM | POA: Diagnosis present

## 2023-08-24 DIAGNOSIS — R17 Unspecified jaundice: Secondary | ICD-10-CM

## 2023-08-24 DIAGNOSIS — R7309 Other abnormal glucose: Secondary | ICD-10-CM

## 2023-08-24 DIAGNOSIS — K219 Gastro-esophageal reflux disease without esophagitis: Secondary | ICD-10-CM

## 2023-08-24 DIAGNOSIS — D72819 Decreased white blood cell count, unspecified: Secondary | ICD-10-CM

## 2023-08-24 DIAGNOSIS — R350 Frequency of micturition: Secondary | ICD-10-CM | POA: Diagnosis not present

## 2023-08-24 DIAGNOSIS — R140 Abdominal distension (gaseous): Secondary | ICD-10-CM

## 2023-08-24 DIAGNOSIS — E7800 Pure hypercholesterolemia, unspecified: Secondary | ICD-10-CM

## 2023-08-24 NOTE — Progress Notes (Signed)
 Assessment & Plan:  [name] was seen today for medical management of chronic issues.  Diagnoses and all orders for this visit:  Elevated glucose -     Hemoglobin A1c  Elevated bilirubin -     CMP14+EGFR  Leukopenia, unspecified type -     CBC with Differential  Urine frequency Intermittent urinary frequency  -     PSA  Hypercholesterolemia -     Lipid panel INSTRUCTIONS: Work on a low fat, heart healthy diet and participate in regular aerobic exercise program by working out at least 150 minutes per week; 5 days a week-30 minutes per day. Avoid red meat/beef/steak,  fried foods. junk foods, sodas, sugary drinks, unhealthy snacking, alcohol and smoking.  Drink at least 80 oz of water per day and monitor your carbohydrate intake daily.    High risk heterosexual behavior -     Urine cytology ancillary only     Patient has been counseled on age-appropriate routine health concerns for screening and prevention. These are reviewed and up-to-date. Referrals have been placed accordingly. Immunizations are up-to-date or declined.    Subjective:   Chief Complaint  Patient presents with   Medical Management of Chronic Issues    Melvin Medina 53 y.o. male presents to office today for follow up to GERD.   Melvin Medina was diagnosed with hpylori which was eradicated with triple therapy. He no longer is taking PPI. Denies any symptoms of GERD.   Review of Systems  Constitutional:  Negative for fever, malaise/fatigue and weight loss.  HENT: Negative.  Negative for nosebleeds.   Eyes: Negative.  Negative for blurred vision, double vision and photophobia.  Respiratory: Negative.  Negative for cough and shortness of breath.   Cardiovascular: Negative.  Negative for chest pain, palpitations and leg swelling.  Gastrointestinal: Negative.  Negative for heartburn, nausea and vomiting.  Genitourinary:  Positive for frequency. Negative for dysuria, flank pain, hematuria and urgency.   Musculoskeletal: Negative.  Negative for myalgias.  Neurological: Negative.  Negative for dizziness, focal weakness, seizures and headaches.  Psychiatric/Behavioral: Negative.  Negative for suicidal ideas.     Past Medical History:  Diagnosis Date   GERD (gastroesophageal reflux disease)    GI bleed     Past Surgical History:  Procedure Laterality Date   APPENDECTOMY      Family History  Problem Relation Age of Onset   Hypertension Mother     Social History Reviewed with no changes to be made today.   Outpatient Medications Prior to Visit  Medication Sig Dispense Refill   doxycycline (VIBRAMYCIN) 100 MG capsule Take 100 mg by mouth daily.     erythromycin ophthalmic ointment Place 1 Application into the right eye.     clarithromycin  (BIAXIN ) 500 MG tablet Take 1 tablet (500 mg total) by mouth 2 (two) times daily. (Patient not taking: Reported on 08/24/2023) 28 tablet 0   fluticasone  (FLONASE ) 50 MCG/ACT nasal spray Place 2 sprays into both nostrils daily. (Patient not taking: Reported on 08/24/2023) 16 g 6   montelukast  (SINGULAIR ) 10 MG tablet Take 1 tablet (10 mg total) by mouth at bedtime. (Patient not taking: Reported on 08/24/2023) 30 tablet 3   pantoprazole  (PROTONIX ) 40 MG tablet Take 1 tablet (40 mg total) by mouth daily. (Patient not taking: Reported on 08/24/2023) 60 each 3   Simethicone  125 MG CAPS Take one 4 times daily for gas (Patient not taking: Reported on 08/24/2023) 28 capsule 0   No facility-administered medications prior to visit.  Allergies  Allergen Reactions   Aspirin Other (See Comments)    ulcers       Objective:    BP 108/70 (BP Location: Left Arm, Patient Position: Sitting, Cuff Size: Normal)   Pulse 70   Resp 19   Ht 5' 10.5 (1.791 m)   Wt 188 lb 3.2 oz (85.4 kg)   SpO2 100%   BMI 26.62 kg/m  Wt Readings from Last 3 Encounters:  08/24/23 188 lb 3.2 oz (85.4 kg)  06/15/22 194 lb (88 kg)  10/03/21 187 lb 9.6 oz (85.1 kg)    Physical  Exam Vitals and nursing note reviewed.  Constitutional:      Appearance: He is well-developed.  HENT:     Head: Normocephalic and atraumatic.   Cardiovascular:     Rate and Rhythm: Normal rate and regular rhythm.     Heart sounds: Normal heart sounds. No murmur heard.    No friction rub. No gallop.  Pulmonary:     Effort: Pulmonary effort is normal. No tachypnea or respiratory distress.     Breath sounds: Normal breath sounds. No decreased breath sounds, wheezing, rhonchi or rales.  Chest:     Chest wall: No tenderness.  Abdominal:     General: Bowel sounds are normal.     Palpations: Abdomen is soft.   Musculoskeletal:        General: Normal range of motion.     Cervical back: Normal range of motion.   Skin:    General: Skin is warm and dry.   Neurological:     Mental Status: He is alert and oriented to person, place, and time.     Coordination: Coordination normal.   Psychiatric:        Behavior: Behavior normal. Behavior is cooperative.        Thought Content: Thought content normal.        Judgment: Judgment normal.          Patient has been counseled extensively about nutrition and exercise as well as the importance of adherence with medications and regular follow-up. The patient was given clear instructions to go to ER or return to medical center if symptoms don't improve, worsen or new problems develop. The patient verbalized understanding.   Follow-up: Return if symptoms worsen or fail to improve.   Collins Dean, FNP-BC [facility] [place], [place] [phone]   08/24/2023, 4:05 PM

## 2023-08-25 LAB — PSA: Prostate Specific Ag, Serum: 0.7 ng/mL (ref 0.0–4.0)

## 2023-08-25 LAB — CBC WITH DIFFERENTIAL/PLATELET
Basophils Absolute: 0.1 10*3/uL (ref 0.0–0.2)
Basos: 1 %
EOS (ABSOLUTE): 0.2 10*3/uL (ref 0.0–0.4)
Eos: 3 %
Hematocrit: 45.6 % (ref 37.5–51.0)
Hemoglobin: 14.2 g/dL (ref 13.0–17.7)
Immature Grans (Abs): 0 10*3/uL (ref 0.0–0.1)
Immature Granulocytes: 0 %
Lymphocytes Absolute: 2.2 10*3/uL (ref 0.7–3.1)
Lymphs: 44 %
MCH: 28.9 pg (ref 26.6–33.0)
MCHC: 31.1 g/dL — ABNORMAL LOW (ref 31.5–35.7)
MCV: 93 fL (ref 79–97)
Monocytes Absolute: 0.8 10*3/uL (ref 0.1–0.9)
Monocytes: 17 %
Neutrophils Absolute: 1.7 10*3/uL (ref 1.4–7.0)
Neutrophils: 35 %
Platelets: 280 10*3/uL (ref 150–450)
RBC: 4.91 x10E6/uL (ref 4.14–5.80)
RDW: 13 % (ref 11.6–15.4)
WBC: 5 10*3/uL (ref 3.4–10.8)

## 2023-08-25 LAB — CMP14+EGFR
ALT: 18 IU/L (ref 0–44)
AST: 19 IU/L (ref 0–40)
Albumin: 4.2 g/dL (ref 3.8–4.9)
Alkaline Phosphatase: 67 IU/L (ref 44–121)
BUN/Creatinine Ratio: 21 — ABNORMAL HIGH (ref 9–20)
BUN: 21 mg/dL (ref 6–24)
Bilirubin Total: 0.4 mg/dL (ref 0.0–1.2)
CO2: 22 mmol/L (ref 20–29)
Calcium: 9 mg/dL (ref 8.7–10.2)
Chloride: 105 mmol/L (ref 96–106)
Creatinine, Ser: 1.01 mg/dL (ref 0.76–1.27)
Globulin, Total: 2.5 g/dL (ref 1.5–4.5)
Glucose: 92 mg/dL (ref 70–99)
Potassium: 4.2 mmol/L (ref 3.5–5.2)
Sodium: 141 mmol/L (ref 134–144)
Total Protein: 6.7 g/dL (ref 6.0–8.5)
eGFR: 88 mL/min/{1.73_m2} (ref >59)

## 2023-08-25 LAB — HEMOGLOBIN A1C
Est. average glucose Bld gHb Est-mCnc: 108 mg/dL
Hgb A1c MFr Bld: 5.4 % (ref 4.8–5.6)

## 2023-08-25 LAB — LIPID PANEL
Chol/HDL Ratio: 3.1 ratio (ref 0.0–5.0)
Cholesterol, Total: 136 mg/dL (ref 100–199)
HDL: 44 mg/dL (ref >39)
LDL Chol Calc (NIH): 80 mg/dL (ref 0–99)
Triglycerides: 56 mg/dL (ref 0–149)
VLDL Cholesterol Cal: 12 mg/dL (ref 5–40)

## 2023-08-27 ENCOUNTER — Ambulatory Visit: Payer: Self-pay | Admitting: Nurse Practitioner

## 2023-08-27 LAB — URINE CYTOLOGY ANCILLARY ONLY
Chlamydia: NEGATIVE
Comment: NEGATIVE
Comment: NEGATIVE
Comment: NORMAL
Neisseria Gonorrhea: NEGATIVE
Trichomonas: NEGATIVE

## 2023-11-09 ENCOUNTER — Ambulatory Visit: Payer: Self-pay

## 2023-11-09 NOTE — Telephone Encounter (Signed)
          Copied from CRM [id number]. Topic: Clinical - Red Word Triage >> Nov 09, 2023  3:53 PM Rosaria BRAVO wrote: Red Word that prompted transfer to Nurse Triage: Severe pain, bumps on eyes, worsening   ----------------------------------------------------------------------- From previous Reason for Contact - Scheduling: Patient/patient representative is calling to schedule an appointment. Refer to attachments for appointment information. Answer Assessment - Initial Assessment Questions Denies swelling. States he has bumps on his eyes. Used prescription meds for symptoms. Antibiotics, cream. Call disconnected during call.    1. ONSET: When did the pain start? (e.g., minutes, hours, days)     *No Answer* 2. TIMING: Does the pain come and go, or has it been constant since it started? (e.g., constant, intermittent, fleeting)     *No Answer* 3. SEVERITY: How bad is the pain?  (Scale 1-10; mild, moderate or severe)     *No Answer* 4. LOCATION: Where does it hurt?  (e.g., eyelid, eye, cheekbone)     *No Answer* 5. CAUSE: What do you think is causing the pain?     *No Answer* 6. VISION: Do you have blurred vision or changes in your vision?      *No Answer* 7. EYE DISCHARGE: Is there any discharge (pus) from the eye(s)?  If Yes, ask: What color is it?      *No Answer* 8. FEVER: Do you have a fever? If Yes, ask: What is it, how was it measured, and when did it start?      *No Answer* 9. OTHER SYMPTOMS: Do you have any other symptoms? (e.g., headache, nasal discharge, facial rash)     *No Answer* 10. PREGNANCY: Is there any chance you are pregnant? When was your last menstrual period?       *No Answer*  Protocols used: Eye Pain and Other Symptoms-A-AH

## 2023-11-09 NOTE — Telephone Encounter (Signed)
 First attempt: LVM for patient to return call to [phone]  Copied from CRM [id number]. Topic: Clinical - Red Word Triage >> Nov 09, 2023  3:53 PM Rosaria BRAVO wrote: Red Word that prompted transfer to Nurse Triage: Severe pain, bumps on eyes, worsening

## 2023-11-09 NOTE — Telephone Encounter (Signed)
  FYI Only or Action Required?: FYI only for provider.  Patient was last seen in primary care on 08/24/2023 by Theotis Haze ORN, NP.  Called Nurse Triage reporting Eye Pain.  Symptoms began several weeks ago.  Interventions attempted: Prescription medications: oral antibiotic and cream.  Symptoms are: unchanged.  Triage Disposition: See PCP Within 2 Weeks  Patient/caregiver understands and will follow disposition?: Yes  1.Eye pain for three weeks 2. Eye bumps on bottom of eye 3. Has taken antibiotics and use cream, but has not resolved 4. Right eye with bumps on lower lid near lash line 5. Painful from time time 10/10 6. Minimally blurred vision 7. Denies eye drainage, but may have had fever 8. Admits to runny nose, but denies head ache or rash  Patient would also like to discuss muscle relaxer for sore legs from steel toed boots   First attempt: LVM for patient to return call to [phone]   Copied from CRM #[id number]. Topic: Clinical - Red Word Triage >> Nov 09, 2023  3:53 PM Rosaria BRAVO wrote: Red Word that prompted transfer to Nurse Triage: Severe pain, bumps on eyes, worsening Reason for Disposition  MILD eye pains are a recurrent problem  Protocols used: Eye Pain and Other Symptoms-A-AH

## 2023-11-13 ENCOUNTER — Encounter: Payer: Self-pay | Admitting: Family Medicine

## 2023-11-13 ENCOUNTER — Ambulatory Visit: Admitting: Family Medicine

## 2023-11-13 ENCOUNTER — Ambulatory Visit: Payer: Self-pay | Attending: Family Medicine | Admitting: Family Medicine

## 2023-11-13 VITALS — BP 105/68 | HR 60 | Ht 70.5 in | Wt 186.0 lb

## 2023-11-13 DIAGNOSIS — M62838 Other muscle spasm: Secondary | ICD-10-CM

## 2023-11-13 DIAGNOSIS — M25641 Stiffness of right hand, not elsewhere classified: Secondary | ICD-10-CM | POA: Diagnosis not present

## 2023-11-13 DIAGNOSIS — R5383 Other fatigue: Secondary | ICD-10-CM | POA: Diagnosis not present

## 2023-11-13 DIAGNOSIS — H00022 Hordeolum internum right lower eyelid: Secondary | ICD-10-CM | POA: Diagnosis not present

## 2023-11-13 MED ORDER — AMOXICILLIN-POT CLAVULANATE 875-125 MG PO TABS: 1.0000 | ORAL_TABLET | Freq: Two times a day (BID) | ORAL | 0 refills | Status: AC

## 2023-11-13 MED ORDER — CYCLOBENZAPRINE HCL 10 MG PO TABS: 10.0000 mg | ORAL_TABLET | Freq: Three times a day (TID) | ORAL | 0 refills | Status: DC | PRN

## 2023-11-13 MED ORDER — CYCLOBENZAPRINE HCL 10 MG PO TABS: 10.0000 mg | ORAL_TABLET | Freq: Every evening | ORAL | 0 refills | Status: AC | PRN

## 2023-11-13 NOTE — Progress Notes (Signed)
 Subjective:  Patient ID: Melvin Medina, male    DOB: 03-Sep-1970  Age: 53 y.o. MRN: [id number]  CC: Eye Problem (Bumps under right eye/Low energy)     Discussed the use of AI scribe software for clinical note transcription with the patient, who gave verbal consent to proceed.  History of Present Illness Melvin Medina is a 53 year old male who presents with a bump under his right eye and low energy.  A bump under the right eyelid has been present for two weeks, causing occasional pain and blurry vision described as a 'black mark'. The bump has not changed in size. Erythromycin ointment and oral antibiotics have been ineffective. There is no eye discharge or itching.  Low energy has been present for a few days, which he attributes to insufficient sleep, averaging four hours per night due to a tight work schedule. For the last 3 days he has experienced muscle pain. Muscle pain extends from the right hip to the leg, present for three days, and improves with exercise.  Tylenol provides temporary relief. There is no associated low back pain and leg tingling, but no back injury.  Morning hand stiffness occurs without numbness or tingling especially when he flexes his right upper extremity in a sleeping position.    Past Medical History:  Diagnosis Date   GERD (gastroesophageal reflux disease)    GI bleed     Past Surgical History:  Procedure Laterality Date   APPENDECTOMY      Family History  Problem Relation Age of Onset   Hypertension Mother     Social History   Socioeconomic History   Marital status: Married    Spouse name: Not on file   Number of children: Not on file   Years of education: Not on file   Highest education level: Not on file  Occupational History   Not on file  Tobacco Use   Smoking status: Never   Smokeless tobacco: Never  Vaping Use   Vaping status: Never Used  Substance and Sexual Activity   Alcohol use: No   Drug use: No   Sexual  activity: Never  Other Topics Concern   Not on file  Social History Narrative   Not on file   Social Drivers of Health   Financial Resource Strain: Not on file  Food Insecurity: Not on file  Transportation Needs: Not on file  Physical Activity: Not on file  Stress: Not on file  Social Connections: Unknown (05/31/2022)   Received from [facility]   Social Network    Social Network: Not on file    Allergies  Allergen Reactions   Aspirin Other (See Comments)    ulcers    Outpatient Medications Prior to Visit  Medication Sig Dispense Refill   doxycycline (VIBRAMYCIN) 100 MG capsule Take 100 mg by mouth daily. (Patient not taking: Reported on 11/13/2023)     erythromycin ophthalmic ointment Place 1 Application into the right eye. (Patient not taking: Reported on 11/13/2023)     No facility-administered medications prior to visit.     ROS Review of Systems  Constitutional:  Negative for activity change and appetite change.  HENT:  Negative for sinus pressure and sore throat.   Eyes:  Positive for visual disturbance.  Respiratory:  Negative for chest tightness, shortness of breath and wheezing.   Cardiovascular:  Negative for chest pain and palpitations.  Gastrointestinal:  Negative for abdominal distention, abdominal pain and constipation.  Genitourinary: Negative.   Musculoskeletal: Negative.  Psychiatric/Behavioral:  Positive for sleep disturbance. Negative for behavioral problems and dysphoric mood.     Objective:  BP 105/68   Pulse 60   Ht 5' 10.5 (1.791 m)   Wt 186 lb (84.4 kg)   SpO2 100%   BMI 26.31 kg/m      11/13/2023   10:19 AM 08/24/2023    3:19 PM 06/15/2022    3:08 PM  BP/Weight  Systolic BP 105 108 120  Diastolic BP 68 70 74  Wt. (Lbs) 186 188.2 194  BMI 26.31 kg/m2 26.62 kg/m2 27.44 kg/m2      Physical Exam Constitutional:      Appearance: He is well-developed.  Eyes:     Comments: Lower right inner eyelid with erythematous lesion, no  drainage noted Left is normal  Cardiovascular:     Rate and Rhythm: Normal rate.     Heart sounds: Normal heart sounds. No murmur heard. Pulmonary:     Effort: Pulmonary effort is normal.     Breath sounds: Normal breath sounds. No wheezing or rales.  Chest:     Chest wall: No tenderness.  Abdominal:     General: Bowel sounds are normal. There is no distension.     Palpations: Abdomen is soft. There is no mass.     Tenderness: There is no abdominal tenderness.  Musculoskeletal:        General: Normal range of motion.     Right lower leg: No edema.     Left lower leg: No edema.  Neurological:     Mental Status: He is alert and oriented to person, place, and time.  Psychiatric:        Mood and Affect: Mood normal.        Latest Ref Rng & Units 08/24/2023    3:58 PM 10/03/2021   11:55 AM 09/27/2020    3:25 PM  CMP  Glucose 70 - 99 mg/dL 92  95  99   BUN 6 - 24 mg/dL 21  13  14    Creatinine 0.76 - 1.27 mg/dL 8.98  8.87  8.94   Sodium 134 - 144 mmol/L 141  142  141   Potassium 3.5 - 5.2 mmol/L 4.2  4.4  4.4   Chloride 96 - 106 mmol/L 105  104  101   CO2 20 - 29 mmol/L 22  26  26    Calcium 8.7 - 10.2 mg/dL 9.0  9.5  9.7   Total Protein 6.0 - 8.5 g/dL 6.7  7.3  7.5   Total Bilirubin 0.0 - 1.2 mg/dL 0.4  1.4  1.2   Alkaline Phos 44 - 121 IU/L 67  58  60   AST 0 - 40 IU/L 19  21  18    ALT 0 - 44 IU/L 18  27  22      Lipid Panel     Component Value Date/Time   CHOL 136 08/24/2023 1558   TRIG 56 08/24/2023 1558   HDL 44 08/24/2023 1558   CHOLHDL 3.1 08/24/2023 1558   LDLCALC 80 08/24/2023 1558    CBC    Component Value Date/Time   WBC 5.0 08/24/2023 1558   WBC 6.3 09/11/2018 2135   RBC 4.91 08/24/2023 1558   RBC 5.10 09/11/2018 2135   HGB 14.2 08/24/2023 1558   HCT 45.6 08/24/2023 1558   PLT 280 08/24/2023 1558   MCV 93 08/24/2023 1558   MCH 28.9 08/24/2023 1558   MCH 29.6 09/11/2018 2135   MCHC 31.1 (L)  08/24/2023 1558   MCHC 33.8 09/11/2018 2135   RDW 13.0  08/24/2023 1558   LYMPHSABS 2.2 08/24/2023 1558   MONOABS 0.9 09/11/2018 2135   EOSABS 0.2 08/24/2023 1558   BASOSABS 0.1 08/24/2023 1558    Lab Results  Component Value Date   HGBA1C 5.4 08/24/2023       Assessment & Plan Hordeolum internum of right lower eyelid Hordeolum internum on right lower eyelid for two weeks with pain and visual obstruction. Erythromycin ointment was ineffective. - Prescribe Augmentin  - Advise warm compress on eye several times daily.  Muscle spasm Acute muscle pain from right hip to leg, relieved temporarily by Tylenol - Prescribe Flexeril  once daily at night. - Advise Tylenol or ibuprofen  for daytime pain management.  Hand stiffness Morning hand stiffness with differential including arthritis or carpal tunnel syndrome. - Advise avoiding hand bending during sleep. - Consider arthritis creams or brace.  Fatigue Fatigue from averaging four hours of sleep per night. Safety concerns emphasized, especially while driving. - Advise increasing sleep duration. - Discuss potential caffeine  use for daytime alertness.      Meds ordered this encounter  Medications   DISCONTD: cyclobenzaprine  (FLEXERIL ) 10 MG tablet    Sig: Take 1 tablet (10 mg total) by mouth 3 (three) times daily as needed for muscle spasms.    Dispense:  30 tablet    Refill:  0   amoxicillin -clavulanate (AUGMENTIN ) 875-125 MG tablet    Sig: Take 1 tablet by mouth 2 (two) times daily.    Dispense:  14 tablet    Refill:  0   cyclobenzaprine  (FLEXERIL ) 10 MG tablet    Sig: Take 1 tablet (10 mg total) by mouth at bedtime as needed for muscle spasms.    Dispense:  30 tablet    Refill:  0    Discontinue previous dose    Follow-up: Return if symptoms worsen or fail to improve.       Corrina Sabin, MD, FAAFP. [facility] [place], [place] [phone]   11/13/2023, 12:02 PM

## 2023-11-13 NOTE — Telephone Encounter (Signed)
 Patient has appointment with a provider in office today.

## 2023-11-13 NOTE — Patient Instructions (Signed)
 Stye A stye, also known as a hordeolum, is a bump that forms on an eyelid. It may look like a pimple next to the eyelash. A stye can form inside the eyelid (internal stye) or outside the eyelid (external stye). A stye can cause redness, swelling, and pain on the eyelid. Styes are very common. Anyone can get them at any age. They usually occur in just one eye at a time, but you may have more than one in either eye. What are the causes? A stye is caused by an infection. The infection is almost always caused by bacteria called Staphylococcus aureus. This is a common type of bacteria that lives on the skin. An internal stye may result from an infected oil-producing gland inside the eyelid. An external stye may be caused by an infection at the base of the eyelash (hair follicle). What increases the risk? You are more likely to develop a stye if: You have had a stye before. You have any of these conditions: Red, itchy, inflamed eyelids (blepharitis). A skin condition such as seborrheic dermatitis or rosacea. High fat levels in your blood (lipids). Dry eyes. What are the signs or symptoms? The most common symptom of a stye is eyelid pain. Internal styes are more painful than external styes. Other symptoms may include: Painful swelling of your eyelid. A scratchy feeling in your eye. Tearing and redness of your eye. A pimple-like bump on the edge of the eyelid. Pus draining from the stye. How is this diagnosed? Your health care provider may be able to diagnose a stye just by examining your eye. The health care provider may also check to make sure: You do not have a fever or other signs of a more serious infection. The infection has not spread to other parts of your eye or areas around your eye. How is this treated? Most styes will clear up in a few days without treatment or with warm compresses applied to the area. You may need to use antibiotic drops or ointment to treat an infection. Sometimes,  steroid drops or ointment are used in addition to antibiotics. In some cases, your health care provider may give you a small steroid injection in the eyelid. If your stye does not heal with routine treatment, your health care provider may drain pus from the stye using a thin blade or needle. This may be done if the stye is large, causing a lot of pain, or affecting your vision. Follow these instructions at home: Take over-the-counter and prescription medicines only as told by your health care provider. This includes eye drops or ointments. If you were prescribed an antibiotic medicine, steroid medicine, or both, apply or use them as told by your health care provider. Do not stop using the medicine even if your condition improves. Apply a warm, wet cloth (warm compress) to your eye for 5-10 minutes, 4 to 6 times a day. Clean the affected eyelid as directed by your health care provider. Do not wear contact lenses or eye makeup until your stye has healed and your health care provider says that it is safe. Do not try to pop or drain the stye. Do not rub your eye. Contact a health care provider if: You have chills or a fever. Your stye does not go away after several days. Your stye affects your vision. Your eyeball becomes swollen, red, or painful. Get help right away if: You have pain when moving your eye around. Summary A stye is a bump that forms  on an eyelid. It may look like a pimple next to the eyelash. A stye can form inside the eyelid (internal stye) or outside the eyelid (external stye). A stye can cause redness, swelling, and pain on the eyelid. Your health care provider may be able to diagnose a stye just by examining your eye. Apply a warm, wet cloth (warm compress) to your eye for 5-10 minutes, 4 to 6 times a day. This information is not intended to replace advice given to you by your health care provider. Make sure you discuss any questions you have with your health care  provider. Document Revised: 05/05/2020 Document Reviewed: 05/05/2020 Elsevier Patient Education  2024 [employer].
